# Patient Record
Sex: Male | Born: 1970 | Race: White | Hispanic: No | Marital: Single | Smoking: Current every day smoker
Health system: Southern US, Community
[De-identification: ages and names within clinical notes are randomized; demographics above are authoritative.]

## PROBLEM LIST (undated history)

## (undated) DIAGNOSIS — F419 Anxiety disorder, unspecified: Secondary | ICD-10-CM

## (undated) DIAGNOSIS — G459 Transient cerebral ischemic attack, unspecified: Secondary | ICD-10-CM

## (undated) DIAGNOSIS — Z8489 Family history of other specified conditions: Secondary | ICD-10-CM

## (undated) DIAGNOSIS — R7303 Prediabetes: Secondary | ICD-10-CM

## (undated) DIAGNOSIS — K859 Acute pancreatitis without necrosis or infection, unspecified: Secondary | ICD-10-CM

## (undated) DIAGNOSIS — M1990 Unspecified osteoarthritis, unspecified site: Secondary | ICD-10-CM

## (undated) DIAGNOSIS — F319 Bipolar disorder, unspecified: Secondary | ICD-10-CM

## (undated) DIAGNOSIS — F329 Major depressive disorder, single episode, unspecified: Secondary | ICD-10-CM

## (undated) DIAGNOSIS — F32A Depression, unspecified: Secondary | ICD-10-CM

## (undated) DIAGNOSIS — K219 Gastro-esophageal reflux disease without esophagitis: Secondary | ICD-10-CM

## (undated) DIAGNOSIS — D649 Anemia, unspecified: Secondary | ICD-10-CM

## (undated) DIAGNOSIS — I639 Cerebral infarction, unspecified: Secondary | ICD-10-CM

## (undated) DIAGNOSIS — E039 Hypothyroidism, unspecified: Secondary | ICD-10-CM

## (undated) HISTORY — PX: KNEE ARTHROSCOPY: SUR90

## (undated) HISTORY — PX: APPENDECTOMY: SHX54

## (undated) HISTORY — PX: KNEE SURGERY: SHX244

## (undated) HISTORY — PX: SHOULDER ARTHROSCOPY: SHX128

## (undated) HISTORY — PX: TESTICLE TORSION REDUCTION: SHX795

---

## 2000-10-26 ENCOUNTER — Ambulatory Visit (HOSPITAL_COMMUNITY): Admission: RE | Admit: 2000-10-26 | Discharge: 2000-10-26 | Payer: Self-pay | Admitting: Gastroenterology

## 2001-01-21 ENCOUNTER — Emergency Department (HOSPITAL_COMMUNITY): Admission: EM | Admit: 2001-01-21 | Discharge: 2001-01-22 | Payer: Self-pay | Admitting: Emergency Medicine

## 2001-01-21 ENCOUNTER — Encounter: Payer: Self-pay | Admitting: Emergency Medicine

## 2001-01-22 ENCOUNTER — Encounter: Payer: Self-pay | Admitting: Emergency Medicine

## 2001-07-16 ENCOUNTER — Ambulatory Visit: Admission: RE | Admit: 2001-07-16 | Discharge: 2001-07-16 | Payer: Self-pay | Admitting: Family Medicine

## 2001-07-16 ENCOUNTER — Encounter: Payer: Self-pay | Admitting: Family Medicine

## 2001-10-07 ENCOUNTER — Encounter: Payer: Self-pay | Admitting: Emergency Medicine

## 2001-10-07 ENCOUNTER — Emergency Department (HOSPITAL_COMMUNITY): Admission: EM | Admit: 2001-10-07 | Discharge: 2001-10-07 | Payer: Self-pay | Admitting: Emergency Medicine

## 2003-03-29 ENCOUNTER — Ambulatory Visit (HOSPITAL_BASED_OUTPATIENT_CLINIC_OR_DEPARTMENT_OTHER): Admission: RE | Admit: 2003-03-29 | Discharge: 2003-03-29 | Payer: Self-pay | Admitting: Orthopedic Surgery

## 2005-09-16 ENCOUNTER — Emergency Department (HOSPITAL_COMMUNITY): Admission: EM | Admit: 2005-09-16 | Discharge: 2005-09-16 | Payer: Self-pay | Admitting: Emergency Medicine

## 2006-07-13 ENCOUNTER — Ambulatory Visit: Payer: Self-pay | Admitting: Urology

## 2006-10-11 ENCOUNTER — Emergency Department (HOSPITAL_COMMUNITY): Admission: EM | Admit: 2006-10-11 | Discharge: 2006-10-11 | Payer: Self-pay | Admitting: Emergency Medicine

## 2007-09-08 ENCOUNTER — Emergency Department (HOSPITAL_COMMUNITY): Admission: EM | Admit: 2007-09-08 | Discharge: 2007-09-08 | Payer: Self-pay | Admitting: Emergency Medicine

## 2008-04-03 ENCOUNTER — Emergency Department (HOSPITAL_COMMUNITY): Admission: EM | Admit: 2008-04-03 | Discharge: 2008-04-03 | Payer: Self-pay | Admitting: Emergency Medicine

## 2009-01-26 ENCOUNTER — Emergency Department (HOSPITAL_COMMUNITY): Admission: EM | Admit: 2009-01-26 | Discharge: 2009-01-26 | Payer: Self-pay | Admitting: Emergency Medicine

## 2009-07-31 ENCOUNTER — Emergency Department (HOSPITAL_BASED_OUTPATIENT_CLINIC_OR_DEPARTMENT_OTHER): Admission: EM | Admit: 2009-07-31 | Discharge: 2009-07-31 | Payer: Self-pay | Admitting: Emergency Medicine

## 2010-01-17 ENCOUNTER — Emergency Department (HOSPITAL_COMMUNITY): Admission: EM | Admit: 2010-01-17 | Discharge: 2010-01-17 | Payer: Self-pay | Admitting: Emergency Medicine

## 2010-01-21 ENCOUNTER — Emergency Department (HOSPITAL_COMMUNITY): Admission: EM | Admit: 2010-01-21 | Discharge: 2010-01-21 | Payer: Self-pay | Admitting: Emergency Medicine

## 2010-08-04 ENCOUNTER — Emergency Department (HOSPITAL_COMMUNITY)
Admission: EM | Admit: 2010-08-04 | Discharge: 2010-08-04 | Disposition: A | Discharge status: Home / Self Care | Payer: Self-pay | Attending: Emergency Medicine | Admitting: Emergency Medicine

## 2010-08-04 DIAGNOSIS — Z046 Encounter for general psychiatric examination, requested by authority: Secondary | ICD-10-CM | POA: Insufficient documentation

## 2010-08-04 DIAGNOSIS — F1910 Other psychoactive substance abuse, uncomplicated: Secondary | ICD-10-CM | POA: Insufficient documentation

## 2010-08-04 DIAGNOSIS — R634 Abnormal weight loss: Secondary | ICD-10-CM | POA: Insufficient documentation

## 2010-08-04 LAB — CBC
HCT: 43.3 % (ref 39.0–52.0)
Hemoglobin: 14.8 g/dL (ref 13.0–17.0)
MCH: 27.8 pg (ref 26.0–34.0)
MCHC: 34.2 g/dL (ref 30.0–36.0)
MCV: 81.2 fL (ref 78.0–100.0)
RDW: 13.3 % (ref 11.5–15.5)

## 2010-08-04 LAB — COMPREHENSIVE METABOLIC PANEL
ALT: 15 U/L (ref 0–53)
Alkaline Phosphatase: 69 U/L (ref 39–117)
BUN: 6 mg/dL (ref 6–23)
CO2: 24 mEq/L (ref 19–32)
Calcium: 9.5 mg/dL (ref 8.4–10.5)
GFR calc non Af Amer: >60 mL/min (ref >60)
Glucose, Bld: 99 mg/dL (ref 70–99)
Total Protein: 7.2 g/dL (ref 6.0–8.3)

## 2010-08-04 LAB — URINALYSIS, ROUTINE W REFLEX MICROSCOPIC
Bilirubin Urine: NEGATIVE
Hgb urine dipstick: NEGATIVE
Ketones, ur: 15 mg/dL — AB
Nitrite: NEGATIVE
Urine Glucose, Fasting: NEGATIVE mg/dL
pH: 5.5 (ref 5.0–8.0)

## 2010-08-04 LAB — DIFFERENTIAL
Eosinophils Relative: 1 % (ref 0–5)
Lymphocytes Relative: 30 % (ref 12–46)
Lymphs Abs: 2 10*3/uL (ref 0.7–4.0)
Monocytes Absolute: 0.5 10*3/uL (ref 0.1–1.0)
Monocytes Relative: 8 % (ref 3–12)
Neutro Abs: 3.9 10*3/uL (ref 1.7–7.7)

## 2010-08-04 LAB — RAPID URINE DRUG SCREEN, HOSP PERFORMED
Barbiturates: NOT DETECTED
Cocaine: NOT DETECTED
Opiates: NOT DETECTED
Tetrahydrocannabinol: POSITIVE — AB

## 2010-08-04 LAB — ETHANOL: Alcohol, Ethyl (B): <5 mg/dL (ref 0–10)

## 2010-08-19 ENCOUNTER — Emergency Department (HOSPITAL_BASED_OUTPATIENT_CLINIC_OR_DEPARTMENT_OTHER)
Admission: EM | Admit: 2010-08-19 | Discharge: 2010-08-19 | Disposition: A | Discharge status: Home / Self Care | Payer: Self-pay | Attending: Emergency Medicine | Admitting: Emergency Medicine

## 2010-08-19 DIAGNOSIS — R5383 Other fatigue: Secondary | ICD-10-CM | POA: Insufficient documentation

## 2010-08-19 DIAGNOSIS — R5381 Other malaise: Secondary | ICD-10-CM | POA: Insufficient documentation

## 2010-08-19 DIAGNOSIS — F17200 Nicotine dependence, unspecified, uncomplicated: Secondary | ICD-10-CM | POA: Insufficient documentation

## 2010-08-19 DIAGNOSIS — IMO0002 Reserved for concepts with insufficient information to code with codable children: Secondary | ICD-10-CM | POA: Insufficient documentation

## 2010-09-10 LAB — COMPREHENSIVE METABOLIC PANEL
ALT: 14 U/L (ref 0–53)
AST: 17 U/L (ref 0–37)
Alkaline Phosphatase: 71 U/L (ref 39–117)
Calcium: 9.4 mg/dL (ref 8.4–10.5)
GFR calc Af Amer: >60 mL/min (ref >60)
Glucose, Bld: 94 mg/dL (ref 70–99)
Potassium: 4 mEq/L (ref 3.5–5.1)
Sodium: 145 mEq/L (ref 135–145)
Total Protein: 7.5 g/dL (ref 6.0–8.3)

## 2010-09-10 LAB — CBC
Hemoglobin: 15.3 g/dL (ref 13.0–17.0)
MCHC: 34.8 g/dL (ref 30.0–36.0)
RBC: 5.24 MIL/uL (ref 4.22–5.81)
RDW: 13 % (ref 11.5–15.5)

## 2010-09-10 LAB — URINALYSIS, ROUTINE W REFLEX MICROSCOPIC
Bilirubin Urine: NEGATIVE
Glucose, UA: NEGATIVE mg/dL
Hgb urine dipstick: NEGATIVE
Ketones, ur: NEGATIVE mg/dL
Protein, ur: NEGATIVE mg/dL
Urobilinogen, UA: 0.2 mg/dL (ref 0.0–1.0)

## 2010-09-10 LAB — POCT TOXICOLOGY PANEL: Tetrahydrocannabinol: POSITIVE

## 2010-09-10 LAB — DIFFERENTIAL
Basophils Relative: 2 % — ABNORMAL HIGH (ref 0–1)
Eosinophils Absolute: 0.2 10*3/uL (ref 0.0–0.7)
Eosinophils Relative: 3 % (ref 0–5)
Lymphs Abs: 2.6 10*3/uL (ref 0.7–4.0)
Monocytes Absolute: 0.5 10*3/uL (ref 0.1–1.0)
Monocytes Relative: 8 % (ref 3–12)

## 2010-09-10 LAB — ETHANOL: Alcohol, Ethyl (B): <5 mg/dL (ref 0–10)

## 2010-09-26 LAB — COMPREHENSIVE METABOLIC PANEL
Albumin: 4.3 g/dL (ref 3.5–5.2)
BUN: 4 mg/dL — ABNORMAL LOW (ref 6–23)
Calcium: 9.5 mg/dL (ref 8.4–10.5)
Glucose, Bld: 114 mg/dL — ABNORMAL HIGH (ref 70–99)
Sodium: 139 mEq/L (ref 135–145)
Total Protein: 7.4 g/dL (ref 6.0–8.3)

## 2010-09-26 LAB — URINALYSIS, ROUTINE W REFLEX MICROSCOPIC
Bilirubin Urine: NEGATIVE
Ketones, ur: NEGATIVE mg/dL
Nitrite: NEGATIVE
Protein, ur: NEGATIVE mg/dL
pH: 7.5 (ref 5.0–8.0)

## 2010-09-26 LAB — CBC
HCT: 41.9 % (ref 39.0–52.0)
Hemoglobin: 14.2 g/dL (ref 13.0–17.0)
MCHC: 33.9 g/dL (ref 30.0–36.0)
Platelets: 240 10*3/uL (ref 150–400)
RDW: 13.7 % (ref 11.5–15.5)

## 2010-09-26 LAB — DIFFERENTIAL
Lymphs Abs: 2.6 10*3/uL (ref 0.7–4.0)
Monocytes Relative: 9 % (ref 3–12)
Neutro Abs: 4.5 10*3/uL (ref 1.7–7.7)
Neutrophils Relative %: 58 % (ref 43–77)

## 2010-11-06 NOTE — Op Note (Signed)
NAME:  Alex Turner, Alex Turner                         ACCOUNT NO.:  [id number]   MEDICAL RECORD NO.:  [id number]                   PATIENT TYPE:  AMB   LOCATION:  DSC                                  FACILITY:  [facility]   PHYSICIAN:  Madlyn Frankel. Charlann Boxer, M.D.               DATE OF BIRTH:  11-09-1970   DATE OF PROCEDURE:  03/29/2003  DATE OF DISCHARGE:                                 OPERATIVE REPORT   PREOPERATIVE DIAGNOSIS:  Right knee medial meniscal tear, horizontal  cleavage type.   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  Otherwise intact cartilage with hypertrophic synovium in the  anterior aspect of the knee.   PROCEDURES:  1. Right knee diagnostic and operative arthroscopy with medial meniscal     debridement posteriorly.  2. Anterior synovectomy, partial synovectomy.   SURGEON:  Oley Balm. Charlann Boxer, M.D.   ANESTHESIA:  General.   ESTIMATED BLOOD LOSS:  Minimal.   INDICATION FOR PROCEDURE:  Mr. Allen is a pleasant 40 year old male who  was initially seen following a twisting-type injury involving his right  knee.  He had an MRI performed prior to evaluation by me, which revealed a  cleavage-type tear in the posterior horn of his medial meniscus.  After  discussing risks and benefits in reviewing these with him, as he had had a  prior left knee arthroscopy, he consented for right knee arthroscopy, medial  meniscal debridement.   PROCEDURE IN DETAIL:  The patient was brought to the operative theatre.  Once adequate anesthesia and preoperative antibiotics, 600 mg of Clindamycin  IV, were administered, the patient was positioned supine.  A proximal thigh  tourniquet and leg holder were placed.  The right lower extremity was then  prepped and draped in sterile fashion.  Standard portals were created  beginning with inferior lateral, superior lateral.  Diagnostic  evaluation of  the knee revealed intact patellofemoral cartilage, intact lateral gutter  with no loose bodies, intact lateral compartment, with normal meniscus and  cartilage.  Evaluation of the medial compartment of the knee revealed  abundant anterior synovitis of the anterior aspect of the knee.  Upon  entering the medial compartment of the knee there was evidence of a  degenerative-appearing meniscus that was loose and floppy and wavy mainly in  the posterior horn.  Probe evaluation revealed degenerative meniscus with  allowing the probe to enter the meniscus.  At this point a straight-biting  basket was inserted and debrided, the posterior horn of the medial meniscus,  both the posterior third, central portion of the meniscus.  The shaver was  then introduced and removed the free fragments of meniscus as well as  contoured the remaining meniscus to a smooth border.  The shaver was then  used to debride the anterior synovium that was bogging down the anterior  aspect of his knee, particularly on the medial side.  Following this a re-  evaluation of the knee was carried out to make sure there were no loose  bodies.  A probe was reinserted into the medial compartment to assess  stability of the meniscus, which was stable.  At this point the working  components were removed from the knee, the knee was irrigated with another  couple hundred of saline.  Arthroscopy equipment  was removed.  The portals were reapproximated using 4-0 nylon.  The knee was  injected with 20 mL of 0.25% Marcaine with epinephrine.  The patient's knee  was then placed into a sterile bulky Jones dressing with Adaptic over the  wounds.  The patient was awoken from anesthesia and transferred to the  recovery room in stable condition.                                                Madlyn Frankel Charlann Boxer, M.D.    MDO/MEDQ  D:  03/29/2003  T:  03/30/2003  Job:  [id number]

## 2010-11-06 NOTE — Procedures (Signed)
[facility]  Patient:    Alex Turner, Alex Turner                      MRN: [id number] Proc. Date: 10/26/00 Adm. Date:  [id number] Attending:  Louie Bun CC:         Arvella Merles, M.D.   Procedure Report  PROCEDURE:  Colonoscopy.  INDICATION FOR PROCEDURE:  Family history of colon cancer in a first-degree relative at a young age.  DESCRIPTION OF PROCEDURE:  The patient was placed in the left lateral decubitus position and placed on the pulse monitor with continuous low-flow oxygen delivered by nasal cannula.  He was sedated with 100 mg IV Demerol and 10 mg IV Versed.  The Olympus video colonoscope was inserted into the rectum and advanced to the cecum, confirmed by transillumination at McBurneys point and visualization of the ileocecal valve and appendiceal orifice.  The prep was generally good but somewhat limited in the cecum and the ascending colon. However, with irrigation, I was able to clear this area for adequate inspection.  The cecum, ascending, transverse, descending, and sigmoid colon all appeared normal with no masses, polyps, diverticula, or other mucosal abnormalities.  The rectum likewise appeared normal, and retroflex view of the anus did reveal some small internal hemorrhoids.  The colonoscope was then withdrawn and the patient returned to the recovery room in stable condition. He tolerated the procedure well, and there were no immediate complications.  IMPRESSION:  Small internal hemorrhoids, otherwise normal colonoscopy.  PLAN:  Repeat colonoscopy in five years based on his family history. DD:  10/26/00 TD:  10/27/00 Job: [id number]

## 2011-03-22 LAB — COMPREHENSIVE METABOLIC PANEL
ALT: 19
AST: 21
CO2: 26
Calcium: 9.4
Creatinine, Ser: 0.76
GFR calc Af Amer: >60
GFR calc non Af Amer: >60
Sodium: 137
Total Protein: 7.2

## 2011-03-22 LAB — DIFFERENTIAL
Basophils Relative: 1
Lymphs Abs: 2.5
Monocytes Relative: 6
Neutro Abs: 3.5
Neutrophils Relative %: 54

## 2011-03-22 LAB — URINALYSIS, ROUTINE W REFLEX MICROSCOPIC
Bilirubin Urine: NEGATIVE
Hgb urine dipstick: NEGATIVE
Ketones, ur: NEGATIVE
Nitrite: NEGATIVE
Protein, ur: NEGATIVE
Urobilinogen, UA: 0.2

## 2011-03-22 LAB — CBC
MCHC: 33.3
MCV: 83.6
RDW: 13.7

## 2011-06-25 ENCOUNTER — Encounter: Payer: Self-pay | Admitting: *Deleted

## 2011-06-25 ENCOUNTER — Emergency Department (HOSPITAL_COMMUNITY)
Admission: EM | Admit: 2011-06-25 | Discharge: 2011-06-25 | Disposition: A | Discharge status: Home / Self Care | Payer: Self-pay | Attending: Emergency Medicine | Admitting: Emergency Medicine

## 2011-06-25 DIAGNOSIS — K2970 Gastritis, unspecified, without bleeding: Secondary | ICD-10-CM | POA: Insufficient documentation

## 2011-06-25 DIAGNOSIS — R10819 Abdominal tenderness, unspecified site: Secondary | ICD-10-CM | POA: Insufficient documentation

## 2011-06-25 LAB — COMPREHENSIVE METABOLIC PANEL
ALT: 13 U/L (ref 0–53)
Alkaline Phosphatase: 68 U/L (ref 39–117)
CO2: 26 mEq/L (ref 19–32)
Calcium: 9.9 mg/dL (ref 8.4–10.5)
Chloride: 102 mEq/L (ref 96–112)
GFR calc Af Amer: >90 mL/min (ref >90)
GFR calc non Af Amer: >90 mL/min (ref >90)
Glucose, Bld: 93 mg/dL (ref 70–99)
Sodium: 137 mEq/L (ref 135–145)
Total Bilirubin: 0.3 mg/dL (ref 0.3–1.2)

## 2011-06-25 LAB — CBC
HCT: 42.3 % (ref 39.0–52.0)
MCV: 79.8 fL (ref 78.0–100.0)
Platelets: 306 10*3/uL (ref 150–400)
RBC: 5.3 MIL/uL (ref 4.22–5.81)
WBC: 8.5 10*3/uL (ref 4.0–10.5)

## 2011-06-25 LAB — DIFFERENTIAL
Eosinophils Relative: 2 % (ref 0–5)
Lymphocytes Relative: 35 % (ref 12–46)
Lymphs Abs: 3 10*3/uL (ref 0.7–4.0)
Neutro Abs: 4.7 10*3/uL (ref 1.7–7.7)

## 2011-06-25 MED ORDER — GI COCKTAIL ~~LOC~~
30.0000 mL | Freq: Once | ORAL | Status: AC
  Administered 2011-06-25: 30 mL via ORAL
  Filled 2011-06-25: qty 30

## 2011-06-25 MED ORDER — OMEPRAZOLE 20 MG PO CPDR: 20.0000 mg | DELAYED_RELEASE_CAPSULE | Freq: Every day | ORAL | Status: DC

## 2011-06-25 NOTE — ED Provider Notes (Signed)
History     CSN: [id number]  Arrival date & time 06/25/11  1436   First MD Initiated Contact with Patient 06/25/11 1638      Chief Complaint  Patient presents with  . Abdominal Pain    pt c.o burning sensation in abd, and black tarry stools. reports low energy and "getting full fast."     (Consider location/radiation/quality/duration/timing/severity/associated sxs/prior treatment) Patient is a 41 y.o. male presenting with abdominal pain. The history is provided by the patient.  Abdominal Pain The primary symptoms of the illness include abdominal pain and hematochezia. The primary symptoms of the illness do not include fever, nausea, vomiting, diarrhea or dysuria. Primary symptoms comment: Burning epigastric pain for 2-3 days and dark, black stools. The onset of the illness was gradual.  Associated with: He has a history of alcohol use but none in 3 months. He has used NSAIDs in limited quantity. No history of GI bleeding. Change in bowel habit: He reports change in calibur of stools sometimes to pencil thin size. Symptoms associated with the illness do not include chills or hematuria.  He underwent colonoscopy in 2002 secondary to family history premature colon cancers that was negative at the time. He has not had another scope since that time.  History reviewed. No pertinent past medical history.  Past Surgical History  Procedure Date  . Appendectomy     History reviewed. No pertinent family history.  History  Substance Use Topics  . Smoking status: Current Everyday Smoker    Types: Cigarettes  . Smokeless tobacco: Not on file  . Alcohol Use: No      Review of Systems  Constitutional: Negative for fever and chills.  HENT: Negative.   Respiratory: Negative.   Cardiovascular: Negative.   Gastrointestinal: Positive for abdominal pain, blood in stool and hematochezia. Negative for nausea, vomiting and diarrhea.  Genitourinary: Negative for dysuria and hematuria.    Musculoskeletal: Negative.   Skin: Negative.   Neurological: Negative.     Allergies  Penicillins and Wellbutrin  Home Medications   Current Outpatient Rx  Name Route Sig Dispense Refill  . BISMUTH SUBSALICYLATE 262 MG PO CHEW Oral Chew 524 mg by mouth as needed. Upset stomach     . IBUPROFEN 200 MG PO TABS Oral Take 200 mg by mouth every 6 (six) hours as needed.        BP 121/75  Pulse 66  Temp(Src) 98.8 F (37.1 C) (Oral)  Resp 18  Wt 170 lb (77.111 kg)  SpO2 97%  Physical Exam  Constitutional: He appears well-developed and well-nourished.  HENT:  Head: Normocephalic.  Neck: Normal range of motion. Neck supple.  Cardiovascular: Normal rate and regular rhythm.   Pulmonary/Chest: Effort normal and breath sounds normal.  Abdominal: Soft. Bowel sounds are normal. There is tenderness. There is no rebound and no guarding.       Epigastric and upper quadrant tenderness bilaterally.  Genitourinary: Rectum normal. Guaiac negative stool.  Musculoskeletal: Normal range of motion.  Neurological: He is alert. No cranial nerve deficit.  Skin: Skin is warm and dry. No rash noted.  Psychiatric: He has a normal mood and affect.    ED Course  Procedures (including critical care time)  Labs Reviewed - No data to display No results found.   No diagnosis found.    MDM  Pain is some better with GI Cocktail. Labs unremarkable and guaiac negative. Will discharge home on PPI and recommend primary care follow up.  Rodena Medin, PA 06/25/11 [phone]

## 2011-06-25 NOTE — ED Notes (Signed)
Pt presenting to ed with c/o abdominal pain and burning. Pt states symptoms having been going on for a couple of weeks. Pt states difficulty with urinating at time. Pt states decreased appetite. Pt states he had black tarry stools. Pt states he has also had diarrhea.

## 2011-06-25 NOTE — ED Notes (Signed)
Rainbow drawn. 1 lavender, 1 blue, 1 light green, 1 dark green sent to lab. Urine sent to lab

## 2011-07-02 NOTE — ED Provider Notes (Signed)
Evaluation and management procedures were performed by the PA/NP under my supervision/collaboration.    Haskell Rihn D Glynn Yepes, MD 07/02/11 1630 

## 2013-12-30 ENCOUNTER — Emergency Department (HOSPITAL_COMMUNITY): Payer: No Typology Code available for payment source

## 2013-12-30 ENCOUNTER — Encounter (HOSPITAL_COMMUNITY): Payer: Self-pay | Admitting: Emergency Medicine

## 2013-12-30 ENCOUNTER — Emergency Department (HOSPITAL_COMMUNITY)
Admission: EM | Admit: 2013-12-30 | Discharge: 2013-12-30 | Disposition: A | Discharge status: Home / Self Care | Payer: No Typology Code available for payment source | Attending: Emergency Medicine | Admitting: Emergency Medicine

## 2013-12-30 DIAGNOSIS — M79609 Pain in unspecified limb: Secondary | ICD-10-CM | POA: Insufficient documentation

## 2013-12-30 DIAGNOSIS — M25562 Pain in left knee: Secondary | ICD-10-CM

## 2013-12-30 DIAGNOSIS — Z880 Allergy status to penicillin: Secondary | ICD-10-CM | POA: Insufficient documentation

## 2013-12-30 DIAGNOSIS — Z79899 Other long term (current) drug therapy: Secondary | ICD-10-CM | POA: Insufficient documentation

## 2013-12-30 DIAGNOSIS — F17200 Nicotine dependence, unspecified, uncomplicated: Secondary | ICD-10-CM | POA: Insufficient documentation

## 2013-12-30 DIAGNOSIS — M25569 Pain in unspecified knee: Secondary | ICD-10-CM | POA: Insufficient documentation

## 2013-12-30 DIAGNOSIS — M1990 Unspecified osteoarthritis, unspecified site: Secondary | ICD-10-CM | POA: Insufficient documentation

## 2013-12-30 DIAGNOSIS — K219 Gastro-esophageal reflux disease without esophagitis: Secondary | ICD-10-CM | POA: Insufficient documentation

## 2013-12-30 HISTORY — DX: Unspecified osteoarthritis, unspecified site: M19.90

## 2013-12-30 HISTORY — DX: Prediabetes: R73.03

## 2013-12-30 HISTORY — DX: Gastro-esophageal reflux disease without esophagitis: K21.9

## 2013-12-30 HISTORY — DX: Acute pancreatitis without necrosis or infection, unspecified: K85.90

## 2013-12-30 MED ORDER — HYDROCODONE-ACETAMINOPHEN 5-325 MG PO TABS
2.0000 | ORAL_TABLET | Freq: Once | ORAL | Status: AC
  Administered 2013-12-30: 2 via ORAL
  Filled 2013-12-30: qty 2

## 2013-12-30 MED ORDER — TRAMADOL HCL 50 MG PO TABS: 50.0000 mg | ORAL_TABLET | Freq: Four times a day (QID) | ORAL | Status: DC | PRN

## 2013-12-30 NOTE — Progress Notes (Signed)
VASCULAR LAB PRELIMINARY  PRELIMINARY  PRELIMINARY  PRELIMINARY  Bilateral lower extremity venous duplex completed.    Preliminary report:  Bilateral:  No evidence of DVT, superficial thrombosis, or Baker's Cyst.   Myan Suit, RVS 12/30/2013, 11:26 AM

## 2013-12-30 NOTE — ED Notes (Addendum)
Pt c/o pain and unable to bear weight on knee now after doing a lot of walking this week. Pt denies injury but states that he had surgery on L knee when he was 16.  Pt has no other c/o and is A&O and in NAD

## 2013-12-30 NOTE — ED Notes (Addendum)
Pt reports hx of left knee surgery when he was 43 years old. Denies trauma or injury. Pt reports left knee pain x1 month, but pain increased on Friday 7/10. Now pain radiates down leg into calf, left foot is numb as well. Pain 9/10.pain with ambulation and when apply weight to foot.

## 2013-12-30 NOTE — ED Notes (Signed)
Vascular tech in with pt at this time

## 2013-12-30 NOTE — ED Provider Notes (Signed)
CSN: [id number]     Arrival date & time 12/30/13  [id number] History   First MD Initiated Contact with Patient 12/30/13 0848     Chief Complaint  Patient presents with  . Knee Pain  . calf pain      (Consider location/radiation/quality/duration/timing/severity/associated sxs/prior Treatment) Patient is a 43 y.o. male presenting with knee pain. The history is provided by the patient.  Knee Pain Associated symptoms: no back pain and no fever   pt c/o left knee pain for the past couple days. Pain is located anteriorly, constant, dull, moderate-sev, non radiating. States hx left knee pain/problems since age 43 when sprained/injured playing soccer, had laparoscopic procedure then.  States had been up on left more than normal, increased walking earlier in week.  Now pain in front and medial aspect knee, worse w walking, or straightening. No specific injury recalled. No redness. No skin changes. Pt states lower leg feels swollen/painful as well. No fever or chills.  No hx dvt or pe.     Past Medical History  Diagnosis Date  . Pancreatitis   . Osteoarthritis   . Acid reflux   . Borderline diabetic    Past Surgical History  Procedure Laterality Date  . Appendectomy    . Knee surgery      left knee at 43 yo    History reviewed. No pertinent family history. History  Substance Use Topics  . Smoking status: Current Every Day Smoker    Types: Cigarettes  . Smokeless tobacco: Not on file  . Alcohol Use: No    Review of Systems  Constitutional: Negative for fever.  Respiratory: Negative for shortness of breath.   Cardiovascular: Negative for chest pain and leg swelling.  Musculoskeletal: Negative for back pain.  Skin: Negative for rash and wound.  Neurological: Negative for weakness and numbness.      Allergies  Penicillins and Wellbutrin  Home Medications   Prior to Admission medications   Medication Sig Start Date End Date Taking? Authorizing Provider  baclofen (LIORESAL) 10 MG  tablet Take 10 mg by mouth 3 (three) times daily.   Yes Historical Provider, MD  busPIRone (BUSPAR) 5 MG tablet Take 5 mg by mouth daily.   Yes Historical Provider, MD  cholecalciferol (VITAMIN D) 1000 UNITS tablet Take 1,000 Units by mouth 2 (two) times daily.   Yes Historical Provider, MD  gabapentin (NEURONTIN) 300 MG capsule Take 300 mg by mouth 2 (two) times daily.   Yes Historical Provider, MD  ibuprofen (ADVIL,MOTRIN) 200 MG tablet Take 800 mg by mouth every 6 (six) hours as needed for mild pain or moderate pain.    Yes Historical Provider, MD  loratadine (CLARITIN) 10 MG tablet Take 10 mg by mouth daily.   Yes Historical Provider, MD  meloxicam (MOBIC) 15 MG tablet Take 15 mg by mouth daily.   Yes Historical Provider, MD  Multiple Vitamin (MULTIVITAMIN WITH MINERALS) TABS tablet Take 1 tablet by mouth daily.   Yes Historical Provider, MD  omeprazole (PRILOSEC) 20 MG capsule Take 20 mg by mouth 2 (two) times daily before a meal.   Yes Historical Provider, MD  ondansetron (ZOFRAN) 4 MG tablet Take 4 mg by mouth every 6 (six) hours as needed for nausea or vomiting.   Yes Historical Provider, MD  Oxcarbazepine (TRILEPTAL) 300 MG tablet Take 300 mg by mouth 2 (two) times daily.   Yes Historical Provider, MD  terazosin (HYTRIN) 5 MG capsule Take 5 mg by mouth at bedtime.  Yes Historical Provider, MD  traZODone (DESYREL) 50 MG tablet Take 50 mg by mouth at bedtime.   Yes Historical Provider, MD   BP 116/66  Pulse 67  Temp(Src) 98.2 F (36.8 C) (Oral)  Resp 16  Wt 197 lb (89.359 kg)  SpO2 98% Physical Exam  Nursing note and vitals reviewed. Constitutional: He is oriented to person, place, and time. He appears well-developed and well-nourished. No distress.  HENT:  Head: Atraumatic.  Eyes: Conjunctivae are normal.  Neck: Neck supple. No tracheal deviation present.  Cardiovascular: Normal rate.   Pulmonary/Chest: Effort normal. No accessory muscle usage. No respiratory distress.   Musculoskeletal: Normal range of motion. He exhibits no edema.  Tenderness left knee anteriorly/medially. No sts/effusion. No skin changes or erythema. Knees stable, no gross ligament laxity. No leg swelling/edema. No focal calf tenderness. Distal pulses palp.   Good rom without pain, no findings of septic joint.   Neurological: He is alert and oriented to person, place, and time.  Skin: Skin is warm and dry. He is not diaphoretic.  Psychiatric: He has a normal mood and affect.    ED Course  Procedures (including critical care time) Labs Review   MDM  Xrays.   No meds for pain pta, pt has ride, does not have to drive.  vicodin po.  Reviewed nursing notes and prior charts for additional history.   Vascular tech states doppler neg for dvt, see report below:   Baltasar, Twilley Male [birth year] [id number]            Progress Notes by Kerrin Champagne at 12/30/2013 11:26 AM    Author: Kerrin Champagne Service: Vascular Lab Author Type: Cardiovascular Sonographer   Filed: 12/30/2013 11:27 AM Note Time: 12/30/2013 11:26 AM Status: Signed   Editor: Nolberto Hanlon Slaughter (Cardiovascular Sonographer)      VASCULAR LAB  PRELIMINARY PRELIMINARY PRELIMINARY PRELIMINARY  Bilateral lower extremity venous duplex completed.  Preliminary report: Bilateral: No evidence of DVT, superficial thrombosis, or Baker's Cyst.  SLAUGHTER, [place], RVS  12/30/2013, 11:26 AM     Suzi Roots, MD 12/30/13 1135

## 2013-12-30 NOTE — ED Notes (Signed)
Patient transported to X-ray 

## 2013-12-30 NOTE — ED Notes (Signed)
Gave pt graham crackers and sprite per request after ok by Dr Denton LankSteinl

## 2013-12-30 NOTE — Discharge Instructions (Signed)
Take motrin as need for pain. You may also take ultram as need for pain - no driving when taking. Follow up with orthopedist in the next few weeks if symptoms fail to improve/resolve. Return to ER if worse, new symptoms, fevers, other concern.  You were given pain medication in the ER - no driving for the next 4 hours.   Knee Pain The knee is the complex joint between your thigh and your lower leg. It is made up of bones, tendons, ligaments, and cartilage. The bones that make up the knee are:  The femur in the thigh.  The tibia and fibula in the lower leg.  The patella or kneecap riding in the groove on the lower femur. CAUSES  Knee pain is a common complaint with many causes. A few of these causes are:  Injury, such as:  A ruptured ligament or tendon injury.  Torn cartilage.  Medical conditions, such as:  Gout  Arthritis  Infections  Overuse, over training, or overdoing a physical activity. Knee pain can be minor or severe. Knee pain can accompany debilitating injury. Minor knee problems often respond well to self-care measures or get well on their own. More serious injuries may need medical intervention or even surgery. SYMPTOMS The knee is complex. Symptoms of knee problems can vary widely. Some of the problems are:  Pain with movement and weight bearing.  Swelling and tenderness.  Buckling of the knee.  Inability to straighten or extend your knee.  Your knee locks and you cannot straighten it.  Warmth and redness with pain and fever.  Deformity or dislocation of the kneecap. DIAGNOSIS  Determining what is wrong may be very straight forward such as when there is an injury. It can also be challenging because of the complexity of the knee. Tests to make a diagnosis may include:  Your caregiver taking a history and doing a physical exam.  Routine X-rays can be used to rule out other problems. X-rays will not reveal a cartilage tear. Some injuries of the knee  can be diagnosed by:  Arthroscopy a surgical technique by which a small video camera is inserted through tiny incisions on the sides of the knee. This procedure is used to examine and repair internal knee joint problems. Tiny instruments can be used during arthroscopy to repair the torn knee cartilage (meniscus).  Arthrography is a radiology technique. A contrast liquid is directly injected into the knee joint. Internal structures of the knee joint then become visible on X-ray film.  An MRI scan is a non X-ray radiology procedure in which magnetic fields and a computer produce two- or three-dimensional images of the inside of the knee. Cartilage tears are often visible using an MRI scanner. MRI scans have largely replaced arthrography in diagnosing cartilage tears of the knee.  Blood work.  Examination of the fluid that helps to lubricate the knee joint (synovial fluid). This is done by taking a sample out using a needle and a syringe. TREATMENT The treatment of knee problems depends on the cause. Some of these treatments are:  Depending on the injury, proper casting, splinting, surgery, or physical therapy care will be needed.  Give yourself adequate recovery time. Do not overuse your joints. If you begin to get sore during workout routines, back off. Slow down or do fewer repetitions.  For repetitive activities such as cycling or running, maintain your strength and nutrition.  Alternate muscle groups. For example, if you are a weight lifter, work the upper body  on one day and the lower body the next.  Either tight or weak muscles do not give the proper support for your knee. Tight or weak muscles do not absorb the stress placed on the knee joint. Keep the muscles surrounding the knee strong.  Take care of mechanical problems.  If you have flat feet, orthotics or special shoes may help. See your caregiver if you need help.  Arch supports, sometimes with wedges on the inner or outer  aspect of the heel, can help. These can shift pressure away from the side of the knee most bothered by osteoarthritis.  A brace called an "unloader" brace also may be used to help ease the pressure on the most arthritic side of the knee.  If your caregiver has prescribed crutches, braces, wraps or ice, use as directed. The acronym for this is PRICE. This means protection, rest, ice, compression, and elevation.  Nonsteroidal anti-inflammatory drugs (NSAIDs), can help relieve pain. But if taken immediately after an injury, they may actually increase swelling. Take NSAIDs with food in your stomach. Stop them if you develop stomach problems. Do not take these if you have a history of ulcers, stomach pain, or bleeding from the bowel. Do not take without your caregiver's approval if you have problems with fluid retention, heart failure, or kidney problems.  For ongoing knee problems, physical therapy may be helpful.  Glucosamine and chondroitin are over-the-counter dietary supplements. Both may help relieve the pain of osteoarthritis in the knee. These medicines are different from the usual anti-inflammatory drugs. Glucosamine may decrease the rate of cartilage destruction.  Injections of a corticosteroid drug into your knee joint may help reduce the symptoms of an arthritis flare-up. They may provide pain relief that lasts a few months. You may have to wait a few months between injections. The injections do have a small increased risk of infection, water retention, and elevated blood sugar levels.  Hyaluronic acid injected into damaged joints may ease pain and provide lubrication. These injections may work by reducing inflammation. A series of shots may give relief for as long as 6 months.  Topical painkillers. Applying certain ointments to your skin may help relieve the pain and stiffness of osteoarthritis. Ask your pharmacist for suggestions. Many over the-counter products are approved for temporary  relief of arthritis pain.  In some countries, doctors often prescribe topical NSAIDs for relief of chronic conditions such as arthritis and tendinitis. A review of treatment with NSAID creams found that they worked as well as oral medications but without the serious side effects. PREVENTION  Maintain a healthy weight. Extra pounds put more strain on your joints.  Get strong, stay limber. Weak muscles are a common cause of knee injuries. Stretching is important. Include flexibility exercises in your workouts.  Be smart about exercise. If you have osteoarthritis, chronic knee pain or recurring injuries, you may need to change the way you exercise. This does not mean you have to stop being active. If your knees ache after jogging or playing basketball, consider switching to swimming, water aerobics, or other low-impact activities, at least for a few days a week. Sometimes limiting high-impact activities will provide relief.  Make sure your shoes fit well. Choose footwear that is right for your sport.  Protect your knees. Use the proper gear for knee-sensitive activities. Use kneepads when playing volleyball or laying carpet. Buckle your seat belt every time you drive. Most shattered kneecaps occur in car accidents.  Rest when you are tired. SEEK  MEDICAL CARE IF:  You have knee pain that is continual and does not seem to be getting better.  SEEK IMMEDIATE MEDICAL CARE IF:  Your knee joint feels hot to the touch and you have a high fever. MAKE SURE YOU:   Understand these instructions.  Will watch your condition.  Will get help right away if you are not doing well or get worse. Document Released: 04/04/2007 Document Revised: 08/30/2011 Document Reviewed: 04/04/2007 [facility] Patient Information 2015 [place], [place]. This information is not intended to replace advice given to you by your health care provider. Make sure you discuss any questions you have with your health care provider.

## 2014-03-06 ENCOUNTER — Other Ambulatory Visit: Payer: Self-pay | Admitting: Gastroenterology

## 2014-03-06 DIAGNOSIS — R1013 Epigastric pain: Secondary | ICD-10-CM

## 2014-03-06 DIAGNOSIS — R197 Diarrhea, unspecified: Secondary | ICD-10-CM

## 2014-03-15 ENCOUNTER — Ambulatory Visit
Admission: RE | Admit: 2014-03-15 | Discharge: 2014-03-15 | Disposition: A | Discharge status: Home / Self Care | Payer: No Typology Code available for payment source | Source: Ambulatory Visit | Attending: Gastroenterology | Admitting: Gastroenterology

## 2014-03-15 DIAGNOSIS — R197 Diarrhea, unspecified: Secondary | ICD-10-CM

## 2014-03-15 DIAGNOSIS — R1013 Epigastric pain: Secondary | ICD-10-CM

## 2014-03-20 ENCOUNTER — Encounter (HOSPITAL_COMMUNITY): Payer: Self-pay | Admitting: Pharmacy Technician

## 2014-03-20 ENCOUNTER — Encounter (HOSPITAL_COMMUNITY): Payer: Self-pay | Admitting: *Deleted

## 2014-03-27 ENCOUNTER — Other Ambulatory Visit: Payer: Self-pay | Admitting: Gastroenterology

## 2014-03-28 ENCOUNTER — Encounter (HOSPITAL_COMMUNITY): Payer: Self-pay | Admitting: Anesthesiology

## 2014-03-28 ENCOUNTER — Encounter (HOSPITAL_COMMUNITY)
Admission: RE | Disposition: A | Discharge status: Home / Self Care | Payer: Self-pay | Source: Ambulatory Visit | Attending: Gastroenterology

## 2014-03-28 ENCOUNTER — Ambulatory Visit (HOSPITAL_COMMUNITY): Payer: No Typology Code available for payment source | Admitting: Anesthesiology

## 2014-03-28 ENCOUNTER — Encounter (HOSPITAL_COMMUNITY): Payer: Self-pay | Admitting: *Deleted

## 2014-03-28 ENCOUNTER — Ambulatory Visit (HOSPITAL_COMMUNITY)
Admission: RE | Admit: 2014-03-28 | Discharge: 2014-03-28 | Disposition: A | Discharge status: Home / Self Care | Payer: Self-pay | Source: Ambulatory Visit | Attending: Gastroenterology | Admitting: Gastroenterology

## 2014-03-28 DIAGNOSIS — F319 Bipolar disorder, unspecified: Secondary | ICD-10-CM | POA: Insufficient documentation

## 2014-03-28 DIAGNOSIS — K219 Gastro-esophageal reflux disease without esophagitis: Secondary | ICD-10-CM | POA: Insufficient documentation

## 2014-03-28 DIAGNOSIS — F4310 Post-traumatic stress disorder, unspecified: Secondary | ICD-10-CM | POA: Insufficient documentation

## 2014-03-28 DIAGNOSIS — Z800 Family history of malignant neoplasm of digestive organs: Secondary | ICD-10-CM | POA: Insufficient documentation

## 2014-03-28 DIAGNOSIS — K625 Hemorrhage of anus and rectum: Secondary | ICD-10-CM | POA: Insufficient documentation

## 2014-03-28 DIAGNOSIS — M158 Other polyosteoarthritis: Secondary | ICD-10-CM | POA: Insufficient documentation

## 2014-03-28 DIAGNOSIS — F609 Personality disorder, unspecified: Secondary | ICD-10-CM | POA: Insufficient documentation

## 2014-03-28 DIAGNOSIS — Z880 Allergy status to penicillin: Secondary | ICD-10-CM | POA: Insufficient documentation

## 2014-03-28 DIAGNOSIS — F17210 Nicotine dependence, cigarettes, uncomplicated: Secondary | ICD-10-CM | POA: Insufficient documentation

## 2014-03-28 DIAGNOSIS — Z888 Allergy status to other drugs, medicaments and biological substances status: Secondary | ICD-10-CM | POA: Insufficient documentation

## 2014-03-28 DIAGNOSIS — K2950 Unspecified chronic gastritis without bleeding: Secondary | ICD-10-CM | POA: Insufficient documentation

## 2014-03-28 DIAGNOSIS — F419 Anxiety disorder, unspecified: Secondary | ICD-10-CM | POA: Insufficient documentation

## 2014-03-28 DIAGNOSIS — K648 Other hemorrhoids: Secondary | ICD-10-CM | POA: Insufficient documentation

## 2014-03-28 HISTORY — DX: Family history of other specified conditions: Z84.89

## 2014-03-28 HISTORY — PX: ESOPHAGOGASTRODUODENOSCOPY (EGD) WITH PROPOFOL: SHX5813

## 2014-03-28 HISTORY — DX: Major depressive disorder, single episode, unspecified: F32.9

## 2014-03-28 HISTORY — DX: Anxiety disorder, unspecified: F41.9

## 2014-03-28 HISTORY — DX: Depression, unspecified: F32.A

## 2014-03-28 HISTORY — DX: Bipolar disorder, unspecified: F31.9

## 2014-03-28 HISTORY — DX: Anemia, unspecified: D64.9

## 2014-03-28 HISTORY — PX: COLONOSCOPY WITH PROPOFOL: SHX5780

## 2014-03-28 HISTORY — DX: Hypothyroidism, unspecified: E03.9

## 2014-03-28 SURGERY — ESOPHAGOGASTRODUODENOSCOPY (EGD) WITH PROPOFOL
Anesthesia: Monitor Anesthesia Care

## 2014-03-28 MED ORDER — LACTATED RINGERS IV SOLN: 100.0000 mL | INTRAVENOUS | Status: DC

## 2014-03-28 MED ORDER — KETAMINE HCL 10 MG/ML IJ SOLN
INTRAMUSCULAR | Status: DC | PRN
  Administered 2014-03-28: 40 mg via INTRAVENOUS

## 2014-03-28 MED ORDER — FENTANYL CITRATE 0.05 MG/ML IJ SOLN
INTRAMUSCULAR | Status: DC | PRN
  Administered 2014-03-28 (×2): 50 ug via INTRAVENOUS

## 2014-03-28 MED ORDER — FENTANYL CITRATE 0.05 MG/ML IJ SOLN
INTRAMUSCULAR | Status: AC
  Filled 2014-03-28: qty 2

## 2014-03-28 MED ORDER — PROPOFOL 10 MG/ML IV BOLUS
INTRAVENOUS | Status: AC
Start: 2014-03-28 — End: 2014-03-28
  Filled 2014-03-28: qty 20

## 2014-03-28 MED ORDER — PROPOFOL 10 MG/ML IV BOLUS
INTRAVENOUS | Status: AC
  Filled 2014-03-28: qty 20

## 2014-03-28 MED ORDER — LIDOCAINE HCL (CARDIAC) 20 MG/ML IV SOLN
INTRAVENOUS | Status: DC | PRN
  Administered 2014-03-28: 100 mg via INTRAVENOUS

## 2014-03-28 MED ORDER — GLYCOPYRROLATE 0.2 MG/ML IJ SOLN
INTRAMUSCULAR | Status: AC
  Filled 2014-03-28: qty 1

## 2014-03-28 MED ORDER — GLYCOPYRROLATE 0.2 MG/ML IJ SOLN
INTRAMUSCULAR | Status: DC | PRN
  Administered 2014-03-28: 0.2 mg via INTRAVENOUS

## 2014-03-28 MED ORDER — MIDAZOLAM HCL 2 MG/2ML IJ SOLN
INTRAMUSCULAR | Status: AC
  Filled 2014-03-28: qty 2

## 2014-03-28 MED ORDER — SODIUM CHLORIDE 0.9 % IV SOLN: INTRAVENOUS | Status: DC

## 2014-03-28 MED ORDER — LACTATED RINGERS IV SOLN
INTRAVENOUS | Status: DC
  Administered 2014-03-28: 11:00:00 via INTRAVENOUS

## 2014-03-28 MED ORDER — PROPOFOL INFUSION 10 MG/ML OPTIME
INTRAVENOUS | Status: DC | PRN
  Administered 2014-03-28: 140 ug/kg/min via INTRAVENOUS

## 2014-03-28 MED ORDER — BUTAMBEN-TETRACAINE-BENZOCAINE 2-2-14 % EX AERO
INHALATION_SPRAY | CUTANEOUS | Status: DC | PRN
  Administered 2014-03-28: 2 via TOPICAL

## 2014-03-28 MED ORDER — MIDAZOLAM HCL 5 MG/5ML IJ SOLN
INTRAMUSCULAR | Status: DC | PRN
  Administered 2014-03-28 (×2): 1 mg via INTRAVENOUS

## 2014-03-28 SURGICAL SUPPLY — 24 items

## 2014-03-28 NOTE — Anesthesia Postprocedure Evaluation (Signed)
  Anesthesia Post-op Note  Patient: Alex BroadDavid M Turner  Procedure(s) Performed: Procedure(s) (LRB): ESOPHAGOGASTRODUODENOSCOPY (EGD) WITH PROPOFOL (N/A) COLONOSCOPY WITH PROPOFOL (N/A)  Patient Location: PACU  Anesthesia Type: MAC  Level of Consciousness: awake and alert   Airway and Oxygen Therapy: Patient Spontanous Breathing  Post-op Pain: mild  Post-op Assessment: Post-op Vital signs reviewed, Patient's Cardiovascular Status Stable, Respiratory Function Stable, Patent Airway and No signs of Nausea or vomiting  Last Vitals:  Filed Vitals:   03/28/14 1310  BP: 131/77  Pulse: 64  Temp:   Resp: 12    Post-op Vital Signs: stable   Complications: No apparent anesthesia complications

## 2014-03-28 NOTE — Transfer of Care (Signed)
Immediate Anesthesia Transfer of Care Note  Patient: Alex BroadDavid M Lupercio  Procedure(s) Performed: Procedure(s): ESOPHAGOGASTRODUODENOSCOPY (EGD) WITH PROPOFOL (N/A) COLONOSCOPY WITH PROPOFOL (N/A)  Patient Location: PACU  Anesthesia Type:MAC  Level of Consciousness: awake  Airway & Oxygen Therapy: Patient Spontanous Breathing and Patient connected to nasal cannula oxygen  Post-op Assessment: Report given to PACU RN and Post -op Vital signs reviewed and stable  Post vital signs: Reviewed and stable  Complications: No apparent anesthesia complications

## 2014-03-28 NOTE — Op Note (Signed)
[facility] [address] [place], [id number]   COLONOSCOPY PROCEDURE REPORT  PATIENT: Alex Turner, Alex Turner  MR#: [id number] BIRTHDATE: 1971-06-20 , 43  yrs. old GENDER: male ENDOSCOPIST: Dorena CookeyJohn Ismael Karge, MD REFERRED BY: PROCEDURE DATE:  03/28/2014 PROCEDURE: ASA CLASS: INDICATIONS:rectal bleeding, family history of colon cancer in a first-degree relative MEDICATIONS: MAC  DESCRIPTION OF PROCEDURE:   After the risks and benefits and of the procedure were explained, informed consent was obtained.  digital exam was normal         The Pentax Ped Colon [id number]  endoscope was introduced through the anus and advanced to the cecum, confirmed by transillumination McBurney's point in visualization of ileocecal valve and appendiceal orifice.      .  The quality of the prep was excellent      .  The instrument was then slowly withdrawn as the colon was fully examined.   the cecum, descending, transverse descending and sigmoid colon appeared normal with no masses polyps diverticula or other mucosal abnormalities.          retroflex view the anus revealed small internal hemorrhoids.          The scope was then withdrawn from the patient and the procedure completed.  WITHDRAWAL TIME: 10 minutes  COMPLICATIONS: There were no immediate complications. ENDOSCOPIC IMPRESSION: small internal hemorrhoids otherwise normal study RECOMMENDATIONS: repeat colonoscopy in 5 years based on family history of colon cancer in first-degree relatives.  REPEAT EXAM:  cc:  _______________________________ Rosalie DoctoreSignedDorena Cookey:  Shaniqua Guillot, MD 03/28/2014 12:44 PM   CPT CODES: ICD CODES:  The ICD and CPT codes recommended by this software are interpretations from the data that the clinical staff has captured with the software.  The verification of the translation of this report to the ICD and CPT codes and modifiers is the sole responsibility of the health care institution and practicing physician  where this report was generated.  [employer] will not be held responsible for the validity of the ICD and CPT codes included on this report.  AMA assumes no liability for data contained or not contained herein. CPT is a Publishing rights managerregistered trademark of the [employer].
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
## 2014-03-28 NOTE — Op Note (Signed)
[facility] [address]  [place], [id number]   ENDOSCOPY PROCEDURE REPORT  PATIENT: Alex BroadSchultz, [name]  MR#: [id number] BIRTHDATE: 09/28/1970 , 43  yrs. old GENDER: male ENDOSCOPIST:Sophiea Ueda Madilyn FiremanHayes, MD REFERRED BY: PROCEDURE DATE:  03/28/2014 PROCEDURE: ASA CLASS: INDICATIONS: epigastric pain MEDICATION: MAC TOPICAL ANESTHETIC:   Cetacaine  DESCRIPTION OF PROCEDURE:   After the risks and benefits of the procedure were explained, informed consent was obtained.  The Pentax Gastroscope [id number]  endoscope was introduced through the mouth  and advanced to the second portion of the duodenum .  The instrument was slowly withdrawn as the mucosa was fully examined.    esophagus: Normal        stomach: Mild antral gastritis with no focal erosions or ulcers. Biopsies taken. Duodenum: Slight deformity of the bulb otherwise normal       The scope was then withdrawn from the patient and the procedure completed.  COMPLICATIONS: There were no immediate complications.  ENDOSCOPIC IMPRESSION: mild antral gastritis RECOMMENDATIONS: continue double dose PPI and await antral biopsies.  jhayes _______________________________ Rosalie DoctoreSignedDorena Cookey:  Hasnain Manheim, MD 03/28/2014 12:23 PM     cc:  CPT CODES: ICD CODES:  The ICD and CPT codes recommended by this software are interpretations from the data that the clinical staff has captured with the software.  The verification of the translation of this report to the ICD and CPT codes and modifiers is the sole responsibility of the health care institution and practicing physician where this report was generated.  [employer] will not be held responsible for the validity of the ICD and CPT codes included on this report.  AMA assumes no liability for data contained or not contained herein. CPT is a Publishing rights managerregistered trademark of the [employer].

## 2014-03-28 NOTE — Discharge Instructions (Signed)
YOU HAD AN ENDOSCOPIC PROCEDURE TODAY: Refer to the procedure report that was given to you for any specific questions about what was found during the examination.  If the procedure report does not answer your questions, please call your gastroenterologist to clarify.  YOU SHOULD EXPECT: Some feelings of bloating in the abdomen. Passage of more gas than usual.  Walking can help get rid of the air that was put into your GI tract during the procedure and reduce the bloating. If you had a lower endoscopy (such as a colonoscopy or flexible sigmoidoscopy) you may notice spotting of blood in your stool or on the toilet paper.   DIET: Your first meal following the procedure should be a light meal and then it is ok to progress to your normal diet.  A half-sandwich or bowl of soup is an example of a good first meal.  Heavy or fried foods are harder to digest and may make you feel nasueas or bloated.  Drink plenty of fluids but you should avoid alcoholic beverages for 24 hours.  ACTIVITY: Your care partner should take you home directly after the procedure.  You should plan to take it easy, moving slowly for the rest of the day.  You can resume normal activity the day after the procedure however you should NOT DRIVE or use heavy machinery for 24 hours (because of the sedation medicines used during the test).    SYMPTOMS TO REPORT IMMEDIATELY  A gastroenterologist can be reached at any hour.  Please call your doctor's office for any of the following symptoms:   Following lower endoscopy (colonoscopy, flexible sigmoidoscopy)  Excessive amounts of blood in the stool  Significant tenderness, worsening of abdominal pains  Swelling of the abdomen that is new, acute  Fever of 100 or higher  Following upper endoscopy (EGD, EUS, ERCP)  Vomiting of blood or coffee ground material  New, significant abdominal pain  New, significant chest pain or pain under the shoulder blades  Painful or persistently difficult  swallowing  New shortness of breath  Black, tarry-looking stools  FOLLOW UP: If any biopsies were taken you will be contacted by phone or by letter within the next 1-3 weeks.  Call your gastroenterologist if you have not heard about the biopsies in 3 weeks.  Please also call your gastroenterologist's office with any specific questions about appointments or follow up tests. Followup appointment with Dr. Madilyn FiremanHayes in 3-4 weeks

## 2014-03-28 NOTE — Anesthesia Preprocedure Evaluation (Addendum)
Anesthesia Evaluation  Patient identified by MRN, date of birth, ID band Patient awake    Reviewed: Allergy & Precautions, H&P , NPO status , Patient's Chart, lab work & pertinent test results  Airway Mallampati: II TM Distance: >3 FB Neck ROM: full    Dental  (+) Missing, Dental Advisory Given, Poor Dentition Almost all of front teeth are missing:   Pulmonary neg pulmonary ROS, Current Smoker,  breath sounds clear to auscultation  Pulmonary exam normal       Cardiovascular Exercise Tolerance: Good negative cardio ROS  Rhythm:regular Rate:Normal     Neuro/Psych Anxiety Depression Bipolar Disorder negative neurological ROS  negative psych ROS   GI/Hepatic negative GI ROS, Neg liver ROS, GERD-  Medicated and Controlled,  Endo/Other  negative endocrine ROSHypothyroidism Borderline diabetic  Renal/GU negative Renal ROS  negative genitourinary   Musculoskeletal   Abdominal   Peds  Hematology negative hematology ROS (+)   Anesthesia Other Findings   Reproductive/Obstetrics negative OB ROS                          Anesthesia Physical Anesthesia Plan  ASA: II  Anesthesia Plan: MAC   Post-op Pain Management:    Induction:   Airway Management Planned:   Additional Equipment:   Intra-op Plan:   Post-operative Plan:   Informed Consent: I have reviewed the patients History and Physical, chart, labs and discussed the procedure including the risks, benefits and alternatives for the proposed anesthesia with the patient or authorized representative who has indicated his/her understanding and acceptance.   Dental Advisory Given  Plan Discussed with: CRNA and Surgeon  Anesthesia Plan Comments:         Anesthesia Quick Evaluation

## 2014-03-28 NOTE — Addendum Note (Signed)
Addended by: Dorena CookeyHAYES, Bladyn Tipps on: 03/28/2014 08:01 AM   Modules accepted: Orders

## 2014-03-29 ENCOUNTER — Encounter (HOSPITAL_COMMUNITY): Payer: Self-pay | Admitting: Gastroenterology

## 2014-05-01 ENCOUNTER — Emergency Department (HOSPITAL_COMMUNITY): Payer: Self-pay

## 2014-05-01 ENCOUNTER — Observation Stay (HOSPITAL_COMMUNITY)
Admission: EM | Admit: 2014-05-01 | Discharge: 2014-05-03 | Disposition: A | Discharge status: Home / Self Care | Payer: Self-pay | Attending: Internal Medicine | Admitting: Internal Medicine

## 2014-05-01 ENCOUNTER — Encounter (HOSPITAL_COMMUNITY): Payer: Self-pay | Admitting: Emergency Medicine

## 2014-05-01 ENCOUNTER — Observation Stay (HOSPITAL_COMMUNITY): Payer: Self-pay

## 2014-05-01 DIAGNOSIS — M19011 Primary osteoarthritis, right shoulder: Secondary | ICD-10-CM | POA: Insufficient documentation

## 2014-05-01 DIAGNOSIS — R55 Syncope and collapse: Secondary | ICD-10-CM | POA: Diagnosis present

## 2014-05-01 DIAGNOSIS — Z880 Allergy status to penicillin: Secondary | ICD-10-CM | POA: Insufficient documentation

## 2014-05-01 DIAGNOSIS — F319 Bipolar disorder, unspecified: Secondary | ICD-10-CM | POA: Insufficient documentation

## 2014-05-01 DIAGNOSIS — G458 Other transient cerebral ischemic attacks and related syndromes: Secondary | ICD-10-CM

## 2014-05-01 DIAGNOSIS — G459 Transient cerebral ischemic attack, unspecified: Principal | ICD-10-CM | POA: Insufficient documentation

## 2014-05-01 DIAGNOSIS — E039 Hypothyroidism, unspecified: Secondary | ICD-10-CM | POA: Insufficient documentation

## 2014-05-01 DIAGNOSIS — E119 Type 2 diabetes mellitus without complications: Secondary | ICD-10-CM

## 2014-05-01 DIAGNOSIS — K859 Acute pancreatitis, unspecified: Secondary | ICD-10-CM | POA: Insufficient documentation

## 2014-05-01 DIAGNOSIS — F329 Major depressive disorder, single episode, unspecified: Secondary | ICD-10-CM | POA: Insufficient documentation

## 2014-05-01 DIAGNOSIS — K219 Gastro-esophageal reflux disease without esophagitis: Secondary | ICD-10-CM | POA: Insufficient documentation

## 2014-05-01 DIAGNOSIS — F17210 Nicotine dependence, cigarettes, uncomplicated: Secondary | ICD-10-CM | POA: Insufficient documentation

## 2014-05-01 DIAGNOSIS — M170 Bilateral primary osteoarthritis of knee: Secondary | ICD-10-CM | POA: Insufficient documentation

## 2014-05-01 DIAGNOSIS — M19012 Primary osteoarthritis, left shoulder: Secondary | ICD-10-CM | POA: Insufficient documentation

## 2014-05-01 DIAGNOSIS — G629 Polyneuropathy, unspecified: Secondary | ICD-10-CM | POA: Insufficient documentation

## 2014-05-01 DIAGNOSIS — M160 Bilateral primary osteoarthritis of hip: Secondary | ICD-10-CM | POA: Insufficient documentation

## 2014-05-01 DIAGNOSIS — D649 Anemia, unspecified: Secondary | ICD-10-CM | POA: Insufficient documentation

## 2014-05-01 DIAGNOSIS — M6289 Other specified disorders of muscle: Secondary | ICD-10-CM

## 2014-05-01 DIAGNOSIS — F419 Anxiety disorder, unspecified: Secondary | ICD-10-CM | POA: Insufficient documentation

## 2014-05-01 DIAGNOSIS — Z888 Allergy status to other drugs, medicaments and biological substances status: Secondary | ICD-10-CM | POA: Insufficient documentation

## 2014-05-01 DIAGNOSIS — R531 Weakness: Secondary | ICD-10-CM | POA: Insufficient documentation

## 2014-05-01 DIAGNOSIS — R4781 Slurred speech: Secondary | ICD-10-CM | POA: Insufficient documentation

## 2014-05-01 DIAGNOSIS — Z79899 Other long term (current) drug therapy: Secondary | ICD-10-CM | POA: Insufficient documentation

## 2014-05-01 LAB — CBC WITH DIFFERENTIAL/PLATELET
BASOS PCT: 0 % (ref 0–1)
Basophils Absolute: 0 10*3/uL (ref 0.0–0.1)
EOS ABS: 0.1 10*3/uL (ref 0.0–0.7)
EOS PCT: 1 % (ref 0–5)
HEMATOCRIT: 46.6 % (ref 39.0–52.0)
Hemoglobin: 15.6 g/dL (ref 13.0–17.0)
Lymphocytes Relative: 29 % (ref 12–46)
Lymphs Abs: 3 10*3/uL (ref 0.7–4.0)
MCH: 27.5 pg (ref 26.0–34.0)
MCHC: 33.5 g/dL (ref 30.0–36.0)
MCV: 82.2 fL (ref 78.0–100.0)
MONO ABS: 0.6 10*3/uL (ref 0.1–1.0)
Monocytes Relative: 6 % (ref 3–12)
NEUTROS PCT: 64 % (ref 43–77)
Neutro Abs: 6.5 10*3/uL (ref 1.7–7.7)
Platelets: 245 10*3/uL (ref 150–400)
RBC: 5.67 MIL/uL (ref 4.22–5.81)
RDW: 13.4 % (ref 11.5–15.5)
WBC: 10.2 10*3/uL (ref 4.0–10.5)

## 2014-05-01 LAB — GLUCOSE, CAPILLARY: Glucose-Capillary: 141 mg/dL — ABNORMAL HIGH (ref 70–99)

## 2014-05-01 LAB — RAPID URINE DRUG SCREEN, HOSP PERFORMED
Amphetamines: NOT DETECTED
BARBITURATES: NOT DETECTED
Benzodiazepines: NOT DETECTED
COCAINE: NOT DETECTED
Opiates: NOT DETECTED
TETRAHYDROCANNABINOL: POSITIVE — AB

## 2014-05-01 LAB — URINALYSIS, ROUTINE W REFLEX MICROSCOPIC
Bilirubin Urine: NEGATIVE
Glucose, UA: NEGATIVE mg/dL
Hgb urine dipstick: NEGATIVE
Ketones, ur: NEGATIVE mg/dL
LEUKOCYTES UA: NEGATIVE
Nitrite: NEGATIVE
PROTEIN: NEGATIVE mg/dL
Specific Gravity, Urine: 1.01 (ref 1.005–1.030)
UROBILINOGEN UA: 0.2 mg/dL (ref 0.0–1.0)
pH: 5.5 (ref 5.0–8.0)

## 2014-05-01 LAB — COMPREHENSIVE METABOLIC PANEL
ALBUMIN: 3.4 g/dL — AB (ref 3.5–5.2)
ALK PHOS: 46 U/L (ref 39–117)
ALT: 15 U/L (ref 0–53)
AST: 21 U/L (ref 0–37)
Anion gap: 13 (ref 5–15)
BUN: 7 mg/dL (ref 6–23)
CALCIUM: 9 mg/dL (ref 8.4–10.5)
CO2: 23 mEq/L (ref 19–32)
Chloride: 102 mEq/L (ref 96–112)
Creatinine, Ser: 0.84 mg/dL (ref 0.50–1.35)
GFR calc non Af Amer: >90 mL/min (ref >90)
GLUCOSE: 93 mg/dL (ref 70–99)
POTASSIUM: 3.9 meq/L (ref 3.7–5.3)
SODIUM: 138 meq/L (ref 137–147)
TOTAL PROTEIN: 6.1 g/dL (ref 6.0–8.3)
Total Bilirubin: 0.3 mg/dL (ref 0.3–1.2)

## 2014-05-01 LAB — ETHANOL

## 2014-05-01 LAB — TROPONIN I: Troponin I: <0.3 ng/mL (ref <0.30)

## 2014-05-01 LAB — CBG MONITORING, ED: Glucose-Capillary: 102 mg/dL — ABNORMAL HIGH (ref 70–99)

## 2014-05-01 MED ORDER — OXCARBAZEPINE 300 MG PO TABS
300.0000 mg | ORAL_TABLET | Freq: Two times a day (BID) | ORAL | Status: DC
  Administered 2014-05-01 – 2014-05-03 (×4): 300 mg via ORAL
  Filled 2014-05-01 (×5): qty 1

## 2014-05-01 MED ORDER — GABAPENTIN 400 MG PO CAPS
400.0000 mg | ORAL_CAPSULE | Freq: Three times a day (TID) | ORAL | Status: DC
  Administered 2014-05-01 – 2014-05-03 (×5): 400 mg via ORAL
  Filled 2014-05-01 (×7): qty 1

## 2014-05-01 MED ORDER — ALPRAZOLAM 0.5 MG PO TABS
0.5000 mg | ORAL_TABLET | Freq: Once | ORAL | Status: AC
  Administered 2014-05-01: 0.5 mg via ORAL
  Filled 2014-05-01: qty 1

## 2014-05-01 MED ORDER — INSULIN ASPART 100 UNIT/ML ~~LOC~~ SOLN: 0.0000 [IU] | Freq: Every day | SUBCUTANEOUS | Status: DC

## 2014-05-01 MED ORDER — LORATADINE 10 MG PO TABS
10.0000 mg | ORAL_TABLET | Freq: Every day | ORAL | Status: DC
  Administered 2014-05-01 – 2014-05-02 (×2): 10 mg via ORAL
  Filled 2014-05-01 (×3): qty 1

## 2014-05-01 MED ORDER — ASPIRIN 325 MG PO TABS
325.0000 mg | ORAL_TABLET | Freq: Every day | ORAL | Status: DC
  Administered 2014-05-02 – 2014-05-03 (×2): 325 mg via ORAL
  Filled 2014-05-01 (×2): qty 1

## 2014-05-01 MED ORDER — INFLUENZA VAC SPLIT QUAD 0.5 ML IM SUSY
0.5000 mL | PREFILLED_SYRINGE | INTRAMUSCULAR | Status: AC
  Administered 2014-05-02: 0.5 mL via INTRAMUSCULAR
  Filled 2014-05-01 (×2): qty 0.5

## 2014-05-01 MED ORDER — PANTOPRAZOLE SODIUM 40 MG PO TBEC
40.0000 mg | DELAYED_RELEASE_TABLET | Freq: Every day | ORAL | Status: DC
Start: 2014-05-02 — End: 2014-05-03
  Administered 2014-05-02 – 2014-05-03 (×2): 40 mg via ORAL
  Filled 2014-05-01 (×2): qty 1

## 2014-05-01 MED ORDER — ACETAMINOPHEN 325 MG PO TABS
650.0000 mg | ORAL_TABLET | ORAL | Status: DC | PRN
  Administered 2014-05-02: 650 mg via ORAL
  Filled 2014-05-01: qty 2

## 2014-05-01 MED ORDER — TRAZODONE HCL 50 MG PO TABS
50.0000 mg | ORAL_TABLET | Freq: Every day | ORAL | Status: DC
  Administered 2014-05-01 – 2014-05-02 (×2): 50 mg via ORAL
  Filled 2014-05-01 (×3): qty 1

## 2014-05-01 MED ORDER — INSULIN ASPART 100 UNIT/ML ~~LOC~~ SOLN
0.0000 [IU] | Freq: Three times a day (TID) | SUBCUTANEOUS | Status: DC
Start: 2014-05-02 — End: 2014-05-03
  Administered 2014-05-02: 3 [IU] via SUBCUTANEOUS
  Administered 2014-05-02: 2 [IU] via SUBCUTANEOUS

## 2014-05-01 MED ORDER — TRAMADOL HCL 50 MG PO TABS
50.0000 mg | ORAL_TABLET | Freq: Four times a day (QID) | ORAL | Status: DC | PRN
  Administered 2014-05-03: 50 mg via ORAL
  Filled 2014-05-01: qty 1

## 2014-05-01 MED ORDER — GADOBENATE DIMEGLUMINE 529 MG/ML IV SOLN
20.0000 mL | Freq: Once | INTRAVENOUS | Status: AC | PRN
  Administered 2014-05-01: 20 mL via INTRAVENOUS

## 2014-05-01 MED ORDER — BUSPIRONE HCL 5 MG PO TABS
5.0000 mg | ORAL_TABLET | Freq: Every day | ORAL | Status: DC
  Administered 2014-05-02 – 2014-05-03 (×2): 5 mg via ORAL
  Filled 2014-05-01 (×2): qty 1

## 2014-05-01 MED ORDER — SODIUM CHLORIDE 0.9 % IV SOLN
INTRAVENOUS | Status: AC
  Administered 2014-05-01: 23:00:00 via INTRAVENOUS

## 2014-05-01 MED ORDER — SODIUM CHLORIDE 0.9 % IV BOLUS (SEPSIS)
500.0000 mL | Freq: Once | INTRAVENOUS | Status: AC
  Administered 2014-05-01: 500 mL via INTRAVENOUS

## 2014-05-01 MED ORDER — STROKE: EARLY STAGES OF RECOVERY BOOK
Freq: Once | Status: AC
  Administered 2014-05-01: 23:00:00
  Filled 2014-05-01: qty 1

## 2014-05-01 MED ORDER — BACLOFEN 10 MG PO TABS
10.0000 mg | ORAL_TABLET | Freq: Three times a day (TID) | ORAL | Status: DC
  Administered 2014-05-01 – 2014-05-03 (×5): 10 mg via ORAL
  Filled 2014-05-01 (×7): qty 1

## 2014-05-01 NOTE — ED Notes (Signed)
Gave sandwich and sprite
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
## 2014-05-01 NOTE — ED Notes (Signed)
cbg 102 

## 2014-05-01 NOTE — ED Notes (Signed)
Patient transported to CT 

## 2014-05-01 NOTE — ED Notes (Signed)
Patient is being transported to MRI. Debbie, Diplomatic Services operational officersecretary has notified 4th floor of the delay of patient coming upstairs.

## 2014-05-01 NOTE — ED Notes (Signed)
Pt seemingly passed out for about 10 seconds when laid completely flat while doing orthostatics. Did sternal rub and became conscious again after sternal rub 2-3 seconds. Very dizzy when sitting and standing up and extremely disoriented. Knows that he is at the hospital but patient states "I don' know what' going on right now. I'm at the hospital it seems" But does remember this writers name.

## 2014-05-01 NOTE — ED Notes (Signed)
Patient transported to X-ray 

## 2014-05-01 NOTE — ED Notes (Signed)
Brought in by EMS from pt's PCP's office after his syncopal episode.  Pt reported that he got dizzy and fell, apparently lost his consciousness because he was awakened being "externally rubbed in chest".  Pt fully awake on EMS' arrival.  Pt reports hitting head, now complaining of 4/10 headache. VS by EMS: BP126/72, P86, O2 sat 97% on RA, CBG103, R18.

## 2014-05-01 NOTE — ED Notes (Signed)
Bed: [id number] Expected date:  Expected time:  Means of arrival:  Comments: EMS - syncopal episode

## 2014-05-01 NOTE — ED Notes (Signed)
Pt unable to tolerate orthostatic vital signs at this time--- pt got extremely dizzy on position change.
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
## 2014-05-02 ENCOUNTER — Observation Stay (HOSPITAL_COMMUNITY): Payer: Self-pay

## 2014-05-02 DIAGNOSIS — F3160 Bipolar disorder, current episode mixed, unspecified: Secondary | ICD-10-CM

## 2014-05-02 DIAGNOSIS — R55 Syncope and collapse: Secondary | ICD-10-CM

## 2014-05-02 DIAGNOSIS — F329 Major depressive disorder, single episode, unspecified: Secondary | ICD-10-CM

## 2014-05-02 DIAGNOSIS — F488 Other specified nonpsychotic mental disorders: Secondary | ICD-10-CM

## 2014-05-02 LAB — LIPID PANEL
Cholesterol: 168 mg/dL (ref 0–200)
HDL: 25 mg/dL — ABNORMAL LOW (ref >39)
LDL Cholesterol: 110 mg/dL — ABNORMAL HIGH (ref 0–99)
Total CHOL/HDL Ratio: 6.7 RATIO
Triglycerides: 167 mg/dL — ABNORMAL HIGH (ref <150)
VLDL: 33 mg/dL (ref 0–40)

## 2014-05-02 LAB — GLUCOSE, CAPILLARY
GLUCOSE-CAPILLARY: 125 mg/dL — AB (ref 70–99)
Glucose-Capillary: 175 mg/dL — ABNORMAL HIGH (ref 70–99)
Glucose-Capillary: 109 mg/dL — ABNORMAL HIGH (ref 70–99)
Glucose-Capillary: 127 mg/dL — ABNORMAL HIGH (ref 70–99)

## 2014-05-02 LAB — TROPONIN I
Troponin I: <0.3 ng/mL (ref <0.30)
Troponin I: <0.3 ng/mL (ref <0.30)

## 2014-05-02 LAB — URINE CULTURE
Colony Count: NO GROWTH
Culture: NO GROWTH

## 2014-05-02 LAB — HEMOGLOBIN A1C
Hgb A1c MFr Bld: 5.7 % — ABNORMAL HIGH (ref <5.7)
Mean Plasma Glucose: 117 mg/dL — ABNORMAL HIGH (ref <117)

## 2014-05-02 LAB — TSH: TSH: 1.63 u[IU]/mL (ref 0.350–4.500)

## 2014-05-02 MED ORDER — SODIUM CHLORIDE 0.9 % IV SOLN
INTRAVENOUS | Status: AC
Start: 2014-05-02 — End: 2014-05-03
  Administered 2014-05-02: 16:00:00 via INTRAVENOUS

## 2014-05-02 MED ORDER — NICOTINE 14 MG/24HR TD PT24
14.0000 mg | MEDICATED_PATCH | Freq: Every day | TRANSDERMAL | Status: DC
Start: 2014-05-02 — End: 2014-05-03
  Administered 2014-05-02 – 2014-05-03 (×2): 14 mg via TRANSDERMAL
  Filled 2014-05-02 (×2): qty 1

## 2014-05-02 NOTE — Progress Notes (Signed)
UR completed 

## 2014-05-02 NOTE — Progress Notes (Signed)
VASCULAR LAB PRELIMINARY  PRELIMINARY  PRELIMINARY  PRELIMINARY  Carotid Dopplers completed.    Preliminary report:  1-39% ICA stenosis, lowest end of scale.  Vertebral artery flow is antegrade.  Meliton Samad, RVT 05/02/2014, 9:26 AM
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
## 2014-05-02 NOTE — Progress Notes (Signed)
Patient ID: Alex Turner, male   DOB: 08/29/1970, 43 y.o.   MRN: [id number] [facility] PROGRESS NOTE  Alex BroadDavid M Kamer QMV:[id number] DOB: 06/09/1971 DOA: 05/01/2014 PCP: Dartha LodgeSTEELE,ANTHONY, FNP  Brief narrative: 43 y.o. male with past medical history of borderline personality disorder, bipolar, anxiety, depression, diabetes who [facility] with complaints of sudden onset confusion, slurred speach and lower extremity weakness left more than right. He reported he nearly passed out. Pt does not recall having similar episodes in past. By now, his symptoms have mostly resolved except for LE weakness. CT head and MRI brain essentially unremarkable.  Assessment/Plan:    Active Problems: TIA (transient ischemic attack) - CT head and MRI brain all essentially unremarkable - follow u 2 D ECHO, carotid doppler - per PT eval - SNF recommended - continue aspirin  - fasting lipid panel revealed TGC 137, HDL 25 and LDL 110 - A1c is pending; since diabetic goal LDL is less than 70. Will add low dose statin therapy  - BP less than 135/85 (At goal) - EEG not yet done - appreciate neurology following Probable new diabetes - follow up A1c level - on SSI Neuropathy  - continue gabapentin 400 mg PO TID Bipolar disorder, depression - resumed home meds   DVT Prophylaxis    Code Status: Full.  Family Communication:  plan of care discussed with the patient Disposition Plan: per PT, SNF recommended   IV access:   PeripheralIV  Procedures and diagnostic studies:    Dg Chest 2 View 05/01/2014  No active cardiopulmonary disease.     Ct Head Wo Contrast 05/01/2014  No acute intracranial abnormality noted.   Electronically Signed   By: Alcide CleverMark  Lukens M.D.   On: 05/01/2014 17:06   Mr Maxine GlennMra Head Wo Contrast 05/01/2014     Normal MRA of the intracranial circulation     Mr Brain Wo Contrast 05/01/2014    Normal MRA of the intracranial circulation    Mr Cervical Spine W Wo Contrast  05/01/2014    1. No MRI evidence for acute traumatic injury within the cervical spine. 2. Degenerative disc disease at C5-6 with resultant moderate right and mild left foraminal narrowing, with mild central canal stenosis. 3. Mild degenerative disc disease at C6-7 with resultant moderate left with mild right foraminal narrowing.     Medical Consultants:   Neurology  Other Consultants:   Physical therapy  IAnti-Infectives:    None    Manson PasseyEVINE, Ahna Konkle, MD  [facility] Pager [phone]  If 7PM-7AM, please contact night-coverage [email or web address] Password [email or web address] 05/02/2014, 2:31 PM   LOS: 1 day    HPI/Subjective: No acute overnight events.  Objective: Filed Vitals:   05/02/14 1051 05/02/14 1053 05/02/14 1055 05/02/14 1426  BP: 124/66 124/75 135/77 123/70  Pulse: 73 61 85 69  Temp: 98.6 F (37 C)   98.1 F (36.7 C)  TempSrc: Oral   Oral  Resp: 19   20  Height:      Weight:      SpO2: 99%   97%    Intake/Output Summary (Last 24 hours) at 05/02/14 1431 Last data filed at 05/02/14 1426  Gross per 24 hour  Intake   1220 ml  Output   2200 ml  Net   -980 ml    Exam:   General:  Pt is alert, follows commands appropriately, not in acute distress  Cardiovascular: Regular rate and rhythm, S1/S2, no murmurs  Respiratory: Clear to auscultation bilaterally, no wheezing,  no crackles, no rhonchi  Abdomen: Soft, non tender, non distended, bowel sounds present  Extremities: No edema, pulses DP and PT palpable bilaterally  Neuro: non focal  Data Reviewed: Basic Metabolic Panel:  Recent Labs Lab 05/01/14 1604  NA 138  K 3.9  CL 102  CO2 23  GLUCOSE 93  BUN 7  CREATININE 0.84  CALCIUM 9.0   Liver Function Tests:  Recent Labs Lab 05/01/14 1604  AST 21  ALT 15  ALKPHOS 46  BILITOT 0.3  PROT 6.1  ALBUMIN 3.4*   No results for input(s): LIPASE, AMYLASE in the last 168 hours. No results for input(s): AMMONIA in the last 168 hours. CBC:  Recent Labs Lab  05/01/14 1604  WBC 10.2  NEUTROABS 6.5  HGB 15.6  HCT 46.6  MCV 82.2  PLT 245   Cardiac Enzymes:  Recent Labs Lab 05/01/14 2036 05/02/14 0400 05/02/14 0945  TROPONINI <0.30 <0.30 <0.30   BNP: Invalid input(s): POCBNP CBG:  Recent Labs Lab 05/01/14 1458 05/01/14 2347 05/02/14 0726 05/02/14 1141  GLUCAP 102* 141* 175* 109*    No results found for this or any previous visit (from the past 240 hour(s)).   Scheduled Meds: . aspirin  325 mg Oral Daily  . baclofen  10 mg Oral TID  . busPIRone  5 mg Oral Daily  . gabapentin  400 mg Oral TID  . insulin aspart  0-15 Units Subcutaneous TID WC  . insulin aspart  0-5 Units Subcutaneous QHS  . loratadine  10 mg Oral QHS  . nicotine  14 mg Transdermal Daily  . Oxcarbazepine  300 mg Oral BID  . pantoprazole  40 mg Oral Daily  . traZODone  50 mg Oral QHS

## 2014-05-02 NOTE — Progress Notes (Signed)
Rn paged this NP secondary to pt "blacking out" during orthostatic vitals. NT was in room taking vitals and pt blacked out while lying down. Sitting and standing vitals were never taken. Pt awakened within seconds and was neurologically intact. Continues to have numbness UE (chronic). BP 110s. No focal neuro findings. MRI/MRA on admission neg. Neuro will see this am. Other tests/workup still pending. Pt did not fall.  Craige CottaKirby, NP

## 2014-05-02 NOTE — Progress Notes (Signed)
EEG Completed; Results Pending  

## 2014-05-02 NOTE — Progress Notes (Signed)
OT Cancellation Note  Patient Details Name: Suella BroadDavid M Lizama MRN: [id number] DOB: 06/14/1971   Cancelled Treatment:    Reason Eval/Treat Not Completed: Patient at procedure or test/ unavailable. Will re check on pt later in day or next day.  Alba CoryREDDING, Almina Schul D 05/02/2014, 12:09 PM

## 2014-05-02 NOTE — Progress Notes (Signed)
This morning, while NT was taking patient's orthostatic vitals, patient blacked out for a second while the lying vitals were being taken. BP was 111/69. Patient came to quickly without needing to be aroused. He did have confusion when he first woke up. Patient also stated his left arm felt more numb then normal. NP made aware and neurologist to see patient in the morning. Standing orthos were not done due to patient's dizziness.

## 2014-05-02 NOTE — Evaluation (Signed)
Physical Therapy Evaluation Patient Details Name: Alex BroadDavid M Turner MRN: [id number] DOB: 09/16/1970 Today's Date: 05/02/2014   History of Present Illness  43 yo male admitted with syncopal epidsode, weakness. Hx of bipolar d/o, DM.  Clinical Impression  On eval, pt required Min assist +2 for mobility-able to walk a short distance ~15 feet with RW. Very unsteady gait. Pt reports weakness and impaired balance. Recommend SNF for rehab depending on progress/resolution of symptoms, signs.     Follow Up Recommendations SNF;Supervision/Assistance - 24 hour    Equipment Recommendations  Rolling walker with 5" wheels (possibly)    Recommendations for Other Services OT consult     Precautions / Restrictions Precautions Precautions: Fall Restrictions Weight Bearing Restrictions: No      Mobility  Bed Mobility Overal bed mobility: Modified Independent Bed Mobility: Supine to Sit     Supine to sit: Min assist        Transfers Overall transfer level: Needs assistance Equipment used: None;Rolling walker (2 wheeled) Transfers: Sit to/from Stand Sit to Stand: Min assist;+2 safety/equipment;+2 physical assistance         General transfer comment: Stood multiple times-pt would stand for ~10-20 seconds then request to sit down. Anterior-posterior and lateral swaing observed. Also noted pt to be holding L foot up off of floor during standing-seemed odd especially since pt was c/o having balance issues.  Ambulation/Gait Ambulation/Gait assistance: Min assist;+2 physical assistance;+2 safety/equipment Ambulation Distance (Feet): 15 Feet Assistive device: Rolling walker (2 wheeled) Gait Pattern/deviations: Decreased stride length;Trunk flexed;Drifts right/left;Staggering right;Staggering left;Step-to pattern     General Gait Details: Step to pattern for increased safety, stability. Assist needed to support pt. L knee did not appear to buckle during short distance. Ambulation appeared  effortful for pt. Pt declined to walk any farther.   Stairs            Wheelchair Mobility    Modified Rankin (Stroke Patients Only)       Balance Overall balance assessment: Needs assistance Sitting-balance support: No upper extremity supported Sitting balance-Leahy Scale: Good     Standing balance support: No upper extremity supported Standing balance-Leahy Scale: Poor                               Pertinent Vitals/Pain Pain Assessment: No/denies pain    Home Living Family/patient expects to be discharged to:: Private residence Living Arrangements: Parent Available Help at Discharge: Family Type of Home: House       Home Layout: One level;Other (Comment) (1 step in house to get in den) Home Equipment: Gilmer Morane - single point;Crutches;Walker - standard      Prior Function Level of Independence: Independent         Comments: pt doesnt drive      Hand Dominance        Extremity/Trunk Assessment   Upper Extremity Assessment: Defer to OT evaluation       LUE Deficits / Details: pt reports numbness LUE - entire arm per pt. With strength testing R a bit stronger than right- but unsure if pt trying for LUE to be weaker   Lower Extremity Assessment: RLE deficits/detail;LLE deficits/detail RLE Deficits / Details: WFL LLE Deficits / Details: During MMT, strength at least3+/5 however not sure if pt is not putting forth full effort  Cervical / Trunk Assessment: Normal  Communication   Communication: No difficulties  Cognition Arousal/Alertness: Awake/alert Behavior During Therapy: WFL for tasks assessed/performed Overall Cognitive  Status: Within Functional Limits for tasks assessed                      General Comments      Exercises        Assessment/Plan    PT Assessment Patient needs continued PT services  PT Diagnosis Difficulty walking;Abnormality of gait   PT Problem List Decreased strength;Decreased range of  motion;Decreased balance;Decreased activity tolerance;Decreased mobility;Decreased knowledge of use of DME;Decreased coordination  PT Treatment Interventions DME instruction;Gait training;Functional mobility training;Therapeutic activities;Therapeutic exercise;Patient/family education;Balance training   PT Goals (Current goals can be found in the Care Plan section) Acute Rehab PT Goals Patient Stated Goal: to get better PT Goal Formulation: With patient Time For Goal Achievement: 05/16/14 Potential to Achieve Goals: Good    Frequency Min 3X/week   Barriers to discharge        Co-evaluation               End of Session Equipment Utilized During Treatment: Gait belt Activity Tolerance: Patient limited by fatigue Patient left: in bed;with call bell/phone within reach;with bed alarm set           Time: [id number] PT Time Calculation (min) (ACUTE ONLY): 14 min   Charges:   PT Evaluation $Initial PT Evaluation Tier I: 1 Procedure PT Treatments $Gait Training: 8-22 mins   PT G Codes:          Rebeca AlertJannie Marilin Kofman, MPT Pager: [phone]

## 2014-05-02 NOTE — Plan of Care (Signed)
Problem: Consults Goal: Skin Care Protocol Initiated - if Braden Score 18 or less If consults are not indicated, leave blank or document N/A  Outcome: Not Applicable Date Met:  16/10/96 Goal: Nutrition Consult-if indicated Outcome: Completed/Met Date Met:  05/02/14 Goal: Diabetes Guidelines if Diabetic/Glucose > 140 If diabetic or lab glucose is > 140 mg/dl - Initiate Diabetes/Hyperglycemia Guidelines & Document Interventions  Outcome: Completed/Met Date Met:  05/02/14  Problem: tPA Day Progression Outcomes-Only if tPA administered Goal: Inclusion criteria for tPA STANDARD: < 3hours from symptoms onset: - Diagnosis of ischemic stroke causing measureable neurological deficit - Neurological signs shold not be minor & isolated - Symptoms of stroke should not be sugestive of SAH - No head trauma or prior stroke in previous 3 months - No gastrointestinal or urinary tract hemorrhage in previous 21 days - No arterial puncture at a noncompressible site in previous 7 days - BP not elevated [systolic <045 mmHg, diastolic < 409 mmHg] - Not taking oral anticoagulant or if being taken INR < or equal 1.7 - Platelet count > or equal 100,000 mm3 - No seizure w/postictal residual neurological impairments - Pt/family understand potential risks/benefits from treatment - Caution in pts with NIHSS > 22 - Seizure at stroke onset eligible for tPA if residual impairments due to stroke and not the seizures. - Neurological signs should not be clearing spontaneously - Exercise caution in treating pt with major deficits - Symptoms onset < 3hrs before beginning treatment - No MI in previous 3 months - No major surgery in previous 14 days - No history previous intracranial hemorrhage - No evidence active bleeding/acute trauma (fracture) on examinations - If receiving heparin in previous 48 hrs, aPTT must be normal range - Abnormal Blood Glucose < 50 or > 400 mg/dL - CT does not show multilobar infarction  [hypodensity >1/3 cerebral hemisphere] EXTENDED: 3-4.5 hrous from symptom onset - Age > 80 years - History of prior stroke and diabetes - Any anticoagulant use prior to admission (even if INR < 1.7) - NIHSS > 25"  Outcome: Not Applicable Date Met:  81/19/14 Goal: Pre tPA NIH Stroke Scale documented Outcome: Not Applicable Date Met:  78/29/56 Goal: Pre tPA BP < 185/110 - see orders for mgmt Outcome: Not Applicable Date Met:  21/30/86 Goal: Pre tPA two IV sites established Outcome: Not Applicable Date Met:  57/84/69 Goal: Pre tPA foley catheter inserted Outcome: Not Applicable Date Met:  62/95/28 Goal: Pre tPA panda/NGT inserted if need anticipated Outcome: Not Applicable Date Met:  41/32/44 Goal: Post tPA no anticoagulants/antiplatelets 24 hrs Outcome: Not Applicable Date Met:  06/22/70 Goal: Post tPA VS, neuro checks Outcome: Not Applicable Date Met:  53/66/44 Goal: Post tPA keep BP </equal 180/105 - see orders for mgmt Outcome: Not Applicable Date Met:  03/47/42 Goal: Post tPA call MD if neuro decline - plan for STAT CT Outcome: Not Applicable Date Met:  59/56/38 Goal: Post tPA CT brain w/o contrast 24 hrs post tPA Outcome: Not Applicable Date Met:  75/64/33 Goal: Post tPA bedrest for 24 hours Outcome: Not Applicable Date Met:  29/51/88 Goal: Post tPA fall precautions Outcome: Not Applicable Date Met:  41/66/06 Goal: Post tPA monitor for S/S of bleeding Outcome: Not Applicable Date Met:  30/16/01 Goal: Post tPA image without hemorrhage Outcome: Not Applicable Date Met:  09/32/35 Goal: Post tPA no S/S of hemorrhage elsewhere Outcome: Not Applicable Date Met:  57/32/20 Goal: Post tPA neurologically at baseline or improved CRITERIA FOR NEUROLOGICALLY AT BASELINE OR  IMPROVED: - NO S/S OF INC. ICP - AWAKE, ALERT, ORIENTED X3 - SPEECH CLEAR, APPROPRIATE - PERRL - EOMS, BLINK INTACT - FACE SYMMETRICAL - TONGUE/TRACH MIDLINE - GRIPS, PUSH/PULL EQUAL - NO PRONATOR DRIFT -  DORSIPLANTAR FLEXION EQUAL - MOVES ALL EXTREMITIES - SENSATION INTACT - NO NUCHAL RIGIDITY OR PHOTOPHOBIA  Outcome: Not Applicable Date Met:  73/54/30 Goal: Post tPA neuro decline/bleeding complications guide BLEEDING COMPLICATION GUIDE: - STOP TPA - NOTIFY MD ASAP - IF COMPRESSIBLE, HOLD DIRECT PRESSURE - IF NONCOMPRESSIBLE OR UNCONTROLLED BLEEDS, ANTICIPATE MD MAY ORDER STAT:  --- 5 UNITS CRYOPRECIPITATE,  --- 2-3 UNITS FFP IF NEEDED,  --- PLATELET TRANSFUSION IF NEEDED,  --- PRBCS IF NEEDED  Outcome: Not Applicable Date Met:  14/84/03 Goal: Other tPA Day Goals/Outcomes Outcome: Not Applicable Date Met:  97/95/36  Problem: Progression Outcomes Goal: Communication method established Outcome: Completed/Met Date Met:  05/02/14 Goal: If vent dependent, tolerates weaning Outcome: Not Applicable Date Met:  92/23/00 Goal: Progressive activity as tolerated Outcome: Progressing Goal: Tolerating diet/TF at goal rate Outcome: Progressing Goal: Pain controlled Outcome: Progressing Goal: Bowel & Bladder Continence Outcome: Completed/Met Date Met:  05/02/14 Goal: Initial discharge plan initiated Outcome: Progressing
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
## 2014-05-02 NOTE — Progress Notes (Signed)
  Echocardiogram 2D Echocardiogram has been performed.  Leta JunglingCooper, Lysle Yero M 05/02/2014, 12:24 PM

## 2014-05-02 NOTE — Progress Notes (Signed)
Workup has been completed. EEG and 2D echo both unremarkable. Suspect there is a strong functional overlay to his symptoms. No further in-patient neurological workup indicated at this time. Continue on ASA 325mg  daily and agree with plan to add low dose statin. He will need out-patient neurology follow up.  Please call with further questions. Thank you for the opportunity to help take care of your patient.   Elspeth Choeter Autry Prust, DO [facility] [phone]  If 7pm- 7am, please page neurology on call as listed in AMION.

## 2014-05-02 NOTE — Evaluation (Signed)
Occupational Therapy Evaluation Patient Details Name: Suella BroadDavid M Finazzo MRN: [id number] DOB: 01/26/1971 Today's Date: 05/02/2014    History of Present Illness Pt admitted s/p syncopal episode . Pt feels he has left sided weakness and numbness.     Clinical Impression   Pt admitted with syncope. Pt currently with functional limitations due to the deficits listed below (see OT Problem List).  Pt will benefit from skilled OT to increase their safety and independence with ADL and functional mobility for ADL to facilitate discharge to venue listed below.      Follow Up Recommendations  SNF    Equipment Recommendations  None recommended by OT    Recommendations for Other Services       Precautions / Restrictions Precautions Precautions: Fall Restrictions Weight Bearing Restrictions: No      Mobility Bed Mobility Overal bed mobility: Needs Assistance Bed Mobility: Supine to Sit     Supine to sit: Min assist        Transfers Overall transfer level: Needs assistance Equipment used: 1 person hand held assist Transfers: Sit to/from Stand Sit to Stand: Min assist         General transfer comment: pt had a hard time standing up initiallty- but then phone rang and pt able to stand and talk on phone.  Upon hanging up phone pt lost balance and sat quickly on bed    Balance Overall balance assessment: Needs assistance Sitting-balance support: No upper extremity supported Sitting balance-Leahy Scale: Good     Standing balance support: No upper extremity supported Standing balance-Leahy Scale: Fair                              ADL Overall ADL's : Needs assistance/impaired     Grooming: Set up;Sitting   Upper Body Bathing: Set up;Sitting   Lower Body Bathing: Sit to/from stand;Moderate assistance   Upper Body Dressing : Set up;Sitting   Lower Body Dressing: Sit to/from stand;Moderate assistance   Toilet Transfer: Moderate assistance Toilet Transfer  Details (indicate cue type and reason): sit to stand only Toileting- ArchitectClothing Manipulation and Hygiene: Sit to/from stand;Moderate assistance         General ADL Comments: pt willing to do what we need to do to get to get him better.  "pt is willing" to get on disability if that is what we suggest.  pt reported to OT he has had a TMI.  pt reports he is very physically limited- very weak on entire left side.       Vision       reports glasses are on order                       Pertinent Vitals/Pain Pain Assessment: No/denies pain     Hand Dominance     Extremity/Trunk Assessment Upper Extremity Assessment Upper Extremity Assessment: LUE deficits/detail LUE Deficits / Details: pt reports numbness LUE - entire arm per pt. With strength testing R a bit stronger than right- but unsure if pt trying for LUE to be weaker LUE Sensation: decreased light touch       Cervical / Trunk Assessment Cervical / Trunk Assessment: Normal   Communication Communication Communication: No difficulties   Cognition Arousal/Alertness: Awake/alert Behavior During Therapy: WFL for tasks assessed/performed Overall Cognitive Status: Within Functional Limits for tasks assessed  General Comments   pt inconsistent with presentation during OT eval            Home Living Family/patient expects to be discharged to:: Private residence Living Arrangements: Parent Available Help at Discharge: Family Type of Home: House       Home Layout: One level;Other (Comment) (1 step in house to get in den)     Bathroom Shower/Tub: Tub/shower unit KeySpanShower/tub characteristics: Curtain Bathroom Toilet: Handicapped height     Home Equipment: Medical laboratory scientific officerCane - single point;Crutches;Walker - standard          Prior Functioning/Environment Level of Independence: Independent        Comments: pt doesnt drive     OT Diagnosis: Generalized weakness   OT Problem List: Decreased  strength;Decreased activity tolerance;Decreased safety awareness;Impaired balance (sitting and/or standing)      OT Goals(Current goals can be found in the care plan section) ADL Goals Pt Will Perform Grooming: with supervision;sitting;standing Pt Will Perform Upper Body Dressing: sitting;Independently Pt Will Perform Lower Body Dressing: Independently;sit to/from stand Pt Will Transfer to Toilet: Independently;ambulating;regular height toilet Pt Will Perform Toileting - Clothing Manipulation and hygiene: with modified independence;sit to/from stand  OT Frequency:     Barriers to D/C:     pt reports he is home alone often and wants to do what he need to do to be I          End of Session Nurse Communication: Mobility status  Activity Tolerance: Patient tolerated treatment well Patient left: in bed   Time: 1225-1246 OT Time Calculation (min): 21 min Charges:  OT General Charges $OT Visit: 1 Procedure OT Evaluation $Initial OT Evaluation Tier I: 1 Procedure OT Treatments $Self Care/Home Management : 8-22 mins G-Codes: OT G-codes **NOT FOR INPATIENT CLASS** Functional Assessment Tool Used: clinical observation Functional Limitation: Self care Self Care Current Status (W0981(G8987): At least 40 percent but less than 60 percent impaired, limited or restricted Self Care Goal Status (X9147(G8988): At least 1 percent but less than 20 percent impaired, limited or restricted  Lucky Trotta, Metro KungLorraine D 05/02/2014, 1:06 PM

## 2014-05-02 NOTE — Procedures (Signed)
ELECTROENCEPHALOGRAM REPORT   Patient: Alex Turner       Room #: [id number] EEG No. ID: [id number] Age: 43 y.o.        Sex: male Referring Physician: Elisabeth Pigeonevine Report Date:  05/02/2014        Interpreting Physician: Thana FarrEYNOLDS,Kabe Mckoy D  History: Alex Turner is an 43 y.o. male with episodes of LUE shaking  Medications:  Scheduled: . aspirin  325 mg Oral Daily  . baclofen  10 mg Oral TID  . busPIRone  5 mg Oral Daily  . gabapentin  400 mg Oral TID  . insulin aspart  0-15 Units Subcutaneous TID WC  . insulin aspart  0-5 Units Subcutaneous QHS  . loratadine  10 mg Oral QHS  . nicotine  14 mg Transdermal Daily  . Oxcarbazepine  300 mg Oral BID  . pantoprazole  40 mg Oral Daily  . traZODone  50 mg Oral QHS    Conditions of Recording:  This is a 16 channel EEG carried out with the patient in the awake, drowsy and asleep states.  Description:  The waking background activity consists of a low voltage, symmetrical, fairly well organized, 10 Hz alpha activity, seen from the parieto-occipital and posterior temporal regions.  Low voltage fast activity, poorly organized, is seen anteriorly and is at times superimposed on more posterior regions.  A mixture of theta and alpha rhythms are seen from the central and temporal regions. The patient drowses with slowing to irregular, low voltage theta and beta activity.   The patient goes in to a light sleep with symmetrical sleep spindles, vertex central sharp transients and irregular slow activity.   Hyperventilation produced a mild to moderate buildup but failed to elicit any abnormalities.  Intermittent photic stimulation was performed but failed to illicit any change in the tracing.   IMPRESSION: Normal electroencephalogram, awake, asleep and with activation procedures. There are no focal lateralizing or epileptiform features.   Thana FarrLeslie Chelsie Burel, MD [facility] [phone] 05/02/2014, 5:51 PM

## 2014-05-02 NOTE — Progress Notes (Signed)
   05/02/14 1428  PT Time Calculation  PT Start Time (ACUTE ONLY) 1324  PT Stop Time (ACUTE ONLY) 1338  PT Time Calculation (min) (ACUTE ONLY) 14 min  PT G-Codes **NOT FOR INPATIENT CLASS**  Functional Assessment Tool Used (clinical judgement)  Functional Limitation Mobility: Walking and moving around  Mobility: Walking and Moving Around Current Status (Z6109(G8978) CJ  Mobility: Walking and Moving Around Goal Status (U0454(G8979) CI  PT General Charges  $$ ACUTE PT VISIT 1 Procedure  PT Evaluation  $Initial PT Evaluation Tier I 1 Procedure  PT Treatments  $Gait Training 8-22 mins   Rebeca AlertJannie Avis Tirone, MPT [phone]

## 2014-05-03 DIAGNOSIS — R4781 Slurred speech: Secondary | ICD-10-CM | POA: Insufficient documentation

## 2014-05-03 DIAGNOSIS — R55 Syncope and collapse: Secondary | ICD-10-CM | POA: Insufficient documentation

## 2014-05-03 LAB — VITAMIN B12: Vitamin B-12: 318 pg/mL (ref 211–911)

## 2014-05-03 LAB — GLUCOSE, CAPILLARY
GLUCOSE-CAPILLARY: 97 mg/dL (ref 70–99)
Glucose-Capillary: 110 mg/dL — ABNORMAL HIGH (ref 70–99)

## 2014-05-03 MED ORDER — POLYETHYLENE GLYCOL 3350 17 G PO PACK
17.0000 g | PACK | Freq: Every day | ORAL | Status: DC
  Administered 2014-05-03: 17 g via ORAL
  Filled 2014-05-03: qty 1

## 2014-05-03 MED ORDER — ASPIRIN EC 81 MG PO TBEC: 81.0000 mg | DELAYED_RELEASE_TABLET | Freq: Every day | ORAL | Status: DC
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
## 2014-05-03 NOTE — Discharge Instructions (Signed)
Weakness Weakness is a lack of strength. It may be felt all over the body (generalized) or in one specific part of the body (focal). Some causes of weakness can be serious. You may need further medical evaluation, especially if you are elderly or you have a history of immunosuppression (such as chemotherapy or HIV), kidney disease, heart disease, or diabetes. CAUSES  Weakness can be caused by many different things, including:  Infection.  Physical exhaustion.  Internal bleeding or other blood loss that results in a lack of red blood cells (anemia).  Dehydration. This cause is more common in elderly people.  Side effects or electrolyte abnormalities from medicines, such as pain medicines or sedatives.  Emotional distress, anxiety, or depression.  Circulation problems, especially severe peripheral arterial disease.  Heart disease, such as rapid atrial fibrillation, bradycardia, or heart failure.  Nervous system disorders, such as Guillain-Barr syndrome, multiple sclerosis, or stroke. DIAGNOSIS  To find the cause of your weakness, your caregiver will take your history and perform a physical exam. Lab tests or X-rays may also be ordered, if needed. TREATMENT  Treatment of weakness depends on the cause of your symptoms and can vary greatly. HOME CARE INSTRUCTIONS   Rest as needed.  Eat a well-balanced diet.  Try to get some exercise every day.  Only take over-the-counter or prescription medicines as directed by your caregiver. SEEK MEDICAL CARE IF:   Your weakness seems to be getting worse or spreads to other parts of your body.  You develop new aches or pains. SEEK IMMEDIATE MEDICAL CARE IF:   You cannot perform your normal daily activities, such as getting dressed and feeding yourself.  You cannot walk up and down stairs, or you feel exhausted when you do so.  You have shortness of breath or chest pain.  You have difficulty moving parts of your body.  You have weakness  in only one area of the body or on only one side of the body.  You have a fever.  You have trouble speaking or swallowing.  You cannot control your bladder or bowel movements.  You have black or bloody vomit or stools. MAKE SURE YOU:  Understand these instructions.  Will watch your condition.  Will get help right away if you are not doing well or get worse. Document Released: 06/07/2005 Document Revised: 12/07/2011 Document Reviewed: 08/06/2011 [facility] [facility] [employer] Patient Information 2015 [place], [place]. This information is not intended to replace advice given to you by your health care provider. Make sure you discuss any questions you have with your health care provider. Transient Ischemic Attack A transient ischemic attack (TIA) is a "warning stroke" that causes stroke-like symptoms. Unlike a stroke, a TIA does not cause permanent damage to the brain. The symptoms of a TIA can happen very fast and do not last long. It is important to know the symptoms of a TIA and what to do. This can help prevent a major stroke or death. CAUSES   A TIA is caused by a temporary blockage in an artery in the brain or neck (carotid artery). The blockage does not allow the brain to get the blood supply it needs and can cause different symptoms. The blockage can be caused by either:  A blood clot.  Fatty buildup (plaque) in a neck or brain artery. RISK FACTORS  High blood pressure (hypertension).  High cholesterol.  Diabetes mellitus.  Heart disease.  The build up of plaque in the blood vessels (peripheral artery disease or atherosclerosis).  The build  up of plaque in the blood vessels providing blood and oxygen to the brain (carotid artery stenosis).  An abnormal heart rhythm (atrial fibrillation).  Obesity.  Smoking.  Taking oral contraceptives (especially in combination with smoking).  Physical inactivity.  A diet high in fats, salt (sodium), and calories.  Alcohol use.  Use of illegal  drugs (especially cocaine and methamphetamine).  Being male.  Being African American.  Being over the age of 44.  Family history of stroke.  Previous history of blood clots, stroke, TIA, or heart attack.  Sickle cell disease. SYMPTOMS  TIA symptoms are the same as a stroke but are temporary. These symptoms usually develop suddenly, or may be newly present upon awakening from sleep:  Sudden weakness or numbness of the face, arm, or leg, especially on one side of the body.  Sudden trouble walking or difficulty moving arms or legs.  Sudden confusion.  Sudden personality changes.  Trouble speaking (aphasia) or understanding.  Difficulty swallowing.  Sudden trouble seeing in one or both eyes.  Double vision.  Dizziness.  Loss of balance or coordination.  Sudden severe headache with no known cause.  Trouble reading or writing.  Loss of bowel or bladder control.  Loss of consciousness. DIAGNOSIS  Your caregiver may be able to determine the presence or absence of a TIA based on your symptoms, history, and physical exam. Computed tomography (CT scan) of the brain is usually performed to help identify a TIA. Other tests may be done to diagnose a TIA. These tests may include:  Electrocardiography.  Continuous heart monitoring.  Echocardiography.  Carotid ultrasonography.  Magnetic resonance imaging (MRI).  A scan of the brain circulation.  Blood tests. PREVENTION  The risk of a TIA can be decreased by appropriately treating high blood pressure, high cholesterol, diabetes, heart disease, and obesity and by quitting smoking, limiting alcohol, and staying physically active. TREATMENT  Time is of the essence. Since the symptoms of TIA are the same as a stroke, it is important to seek treatment as soon as possible because you may need a medicine to dissolve the clot (thrombolytic) that cannot be given if too much time has passed. Treatment options vary. Treatment options  may include rest, oxygen, intravenous (IV) fluids, and medicines to thin the blood (anticoagulants). Medicines and diet may be used to address diabetes, high blood pressure, and other risk factors. Measures will be taken to prevent short-term and long-term complications, including infection from breathing foreign material into the lungs (aspiration pneumonia), blood clots in the legs, and falls. Treatment options include procedures to either remove plaque in the carotid arteries or dilate carotid arteries that have narrowed due to plaque. Those procedures are:  Carotid endarterectomy.  Carotid angioplasty and stenting. HOME CARE INSTRUCTIONS   Take all medicines prescribed by your caregiver. Follow the directions carefully. Medicines may be used to control risk factors for a stroke. Be sure you understand all your medicine instructions.  You may be told to take aspirin or the anticoagulant warfarin. Warfarin needs to be taken exactly as instructed.  Taking too much or too little warfarin is dangerous. Too much warfarin increases the risk of bleeding. Too little warfarin continues to allow the risk for blood clots. While taking warfarin, you will need to have regular blood tests to measure your blood clotting time. A PT blood test measures how long it takes for blood to clot. Your PT is used to calculate another value called an INR. Your PT and INR help your  caregiver to adjust your dose of warfarin. The dose can change for many reasons. It is critically important that you take warfarin exactly as prescribed.  Many foods, especially foods high in vitamin K can interfere with warfarin and affect the PT and INR. Foods high in vitamin K include spinach, kale, broccoli, cabbage, collard and turnip greens, brussels sprouts, peas, cauliflower, seaweed, and parsley as well as beef and pork liver, green tea, and soybean oil. You should eat a consistent amount of foods high in vitamin K. Avoid major changes in  your diet, or notify your caregiver before changing your diet. Arrange a visit with a dietitian to answer your questions.  Many medicines can interfere with warfarin and affect the PT and INR. You must tell your caregiver about any and all medicines you take, this includes all vitamins and supplements. Be especially cautious with aspirin and anti-inflammatory medicines. Do not take or discontinue any prescribed or over-the-counter medicine except on the advice of your caregiver or pharmacist.  Warfarin can have side effects, such as excessive bruising or bleeding. You will need to hold pressure over cuts for longer than usual. Your caregiver or pharmacist will discuss other potential side effects.  Avoid sports or activities that may cause injury or bleeding.  Be mindful when shaving, flossing your teeth, or handling sharp objects.  Alcohol can change the body's ability to handle warfarin. It is best to avoid alcoholic drinks or consume only very small amounts while taking warfarin. Notify your caregiver if you change your alcohol intake.  Notify your dentist or other caregivers before procedures.  Eat a diet that includes 5 or more servings of fruits and vegetables each day. This may reduce the risk of stroke. Certain diets may be prescribed to address high blood pressure, high cholesterol, diabetes, or obesity.  A low-sodium, low-saturated fat, low-trans fat, low-cholesterol diet is recommended to manage high blood pressure.  A low-saturated fat, low-trans fat, low-cholesterol, and high-fiber diet may control cholesterol levels.  A controlled-carbohydrate, controlled-sugar diet is recommended to manage diabetes.  A reduced-calorie, low-sodium, low-saturated fat, low-trans fat, low-cholesterol diet is recommended to manage obesity.  Maintain a healthy weight.  Stay physically active. It is recommended that you get at least 30 minutes of activity on most or all days.  Do not  smoke.  Limit alcohol use even if you are not taking warfarin. Moderate alcohol use is considered to be:  No more than 2 drinks each day for men.  No more than 1 drink each day for nonpregnant women.  Stop drug abuse.  Home safety. A safe home environment is important to reduce the risk of falls. Your caregiver may arrange for specialists to evaluate your home. Having grab bars in the bedroom and bathroom is often important. Your caregiver may arrange for equipment to be used at home, such as raised toilets and a seat for the shower.  Follow all instructions for follow-up with your caregiver. This is very important. This includes any referrals and lab tests. Proper follow up can prevent a stroke or another TIA from occurring. SEEK MEDICAL CARE IF:  You have personality changes.  You have difficulty swallowing.  You are seeing double.  You have dizziness.  You have a fever.  You have skin breakdown. SEEK IMMEDIATE MEDICAL CARE IF:  Any of these symptoms may represent a serious problem that is an emergency. Do not wait to see if the symptoms will go away. Get medical help right away. Call your local  emergency services (911 in [place] Do not drive yourself to the hospital.  You have sudden weakness or numbness of the face, arm, or leg, especially on one side of the body.  You have sudden trouble walking or difficulty moving arms or legs.  You have sudden confusion.  You have trouble speaking (aphasia) or understanding.  You have sudden trouble seeing in one or both eyes.  You have a loss of balance or coordination.  You have a sudden, severe headache with no known cause.  You have new chest pain or an irregular heartbeat.  You have a partial or total loss of consciousness. MAKE SURE YOU:   Understand these instructions.  Will watch your condition.  Will get help right away if you are not doing well or get worse. Document Released: 03/17/2005 Document Revised:  06/12/2013 Document Reviewed: 09/12/2013 [facility] Patient Information 2015 [place], [place]. This information is not intended to replace advice given to you by your health care provider. Make sure you discuss any questions you have with your health care provider.

## 2014-05-03 NOTE — Discharge Summary (Signed)
Physician Discharge Summary  Alex Turner ZOX:[id number] DOB: [birth year] DOA: 05/01/2014  PCP: Alex Lodge, FNP  Admit date: 05/01/2014 Discharge date: 05/03/2014  Recommendations for Outpatient Follow-up:  1. Follow up with PCP in 1-2 weeks after discharge to make sure symptoms are controlled    Discharge Diagnoses:  Active Problems:   Syncope   Diabetes mellitus   TIA (transient ischemic attack)    Discharge Condition: stable   Diet recommendation: as tolerated   History of present illness:  43 y.o. male with past medical history of borderline personality disorder, bipolar, anxiety, depression, diabetes who presented to [facility] 05/01/2014 with complaints of sudden onset confusion, slurred speach and lower extremity weakness left more than right. He reported he nearly passed out. Pt does not recall having similar episodes in past. By now, his symptoms have mostly resolved except for LE weakness. CT head and MRI brain essentially unremarkable.  Assessment/Plan:    Active Problems: TIA (transient ischemic attack) - CT head and MRI brain all essentially unremarkable - 2 D ECHO, EEG, carotid doppler - WNL - per PT eval - HH orders all in place  - continue aspirin daily - fasting lipid panel revealed TGC 137, HDL 25 and LDL 110. He is not diabetic so LDL needs to be monitor on outpt basis  - no further recommendations from neurology  Probable new diabetes - A1c 5.7; prediabetic and i spoke with the pt about the appropriate diet  Neuropathy  - continue gabapentin 400 mg PO TID Bipolar disorder, depression - resumed home meds   DVT Prophylaxis  - SCD's and  Aspirin   Code Status: Full.  Family Communication: plan of care discussed with the patient  IV access:   PeripheralIV  Procedures and diagnostic studies:   Dg Chest 2 View 05/01/2014 No active cardiopulmonary disease.   Ct Head Wo Contrast 05/01/2014 No acute intracranial abnormality noted.  Electronically Signed By: Alex Turner M.D. On: 05/01/2014 17:06   Mr Alex Turner Head Wo Contrast 05/01/2014 Normal MRA of the intracranial circulation   Mr Brain Wo Contrast 05/01/2014 Normal MRA of the intracranial circulation   Mr Cervical Spine W Wo Contrast 05/01/2014 1. No MRI evidence for acute traumatic injury within the cervical spine. 2. Degenerative disc disease at C5-6 with resultant moderate right and mild left foraminal narrowing, with mild central canal stenosis. 3. Mild degenerative disc disease at C6-7 with resultant moderate left with mild right foraminal narrowing.   Medical Consultants:   Neurology  Other Consultants:   Physical therapy  IAnti-Infectives:    None   Signed:  Manson Passey, MD  [facility] 05/03/2014, 11:46 AM  Pager #: [phone]   Discharge Exam: Filed Vitals:   05/03/14 1043  BP: 128/77  Pulse: 64  Temp: 97.3 F (36.3 C)  Resp: 18   Filed Vitals:   05/02/14 2013 05/03/14 0025 05/03/14 0419 05/03/14 1043  BP: 100/67 103/51 120/68 128/77  Pulse: 67 60 65 64  Temp: 97.9 F (36.6 C) 97.7 F (36.5 C) 97.7 F (36.5 C) 97.3 F (36.3 C)  TempSrc: Oral Oral Oral Oral  Resp: 18 18 18 18   Height:      Weight:      SpO2: 97% 96% 98% 97%    General: Pt is alert, follows commands appropriately, not in acute distress Cardiovascular: Regular rate and rhythm, S1/S2 +, no murmurs Respiratory: Clear to auscultation bilaterally, no wheezing, no crackles, no rhonchi Abdominal: Soft, non tender, non distended, bowel sounds +, no guarding  Extremities: no edema, no cyanosis, pulses palpable bilaterally DP and PT Neuro: Grossly nonfocal  Discharge Instructions  Discharge Instructions    Call MD for:  difficulty breathing, headache or visual disturbances    Complete by:  As directed      Call MD for:  persistant dizziness or light-headedness    Complete by:  As directed      Call MD for:  persistant nausea and  vomiting    Complete by:  As directed      Call MD for:  severe uncontrolled pain    Complete by:  As directed      Diet - low sodium heart healthy    Complete by:  As directed      Increase activity slowly    Complete by:  As directed             Medication List    STOP taking these medications        lactated ringers infusion      TAKE these medications        acetaminophen 500 MG tablet  Commonly known as:  TYLENOL  Take 1,000 mg by mouth every 6 (six) hours as needed for headache (headache).     aspirin EC 81 MG tablet  Take 1 tablet (81 mg total) by mouth daily.     baclofen 10 MG tablet  Commonly known as:  LIORESAL  Take 10 mg by mouth 3 (three) times daily.     busPIRone 5 MG tablet  Commonly known as:  BUSPAR  Take 5 mg by mouth every morning.     cholecalciferol 1000 UNITS tablet  Commonly known as:  VITAMIN D  Take 1,000 Units by mouth 2 (two) times daily.     gabapentin 300 MG capsule  Commonly known as:  NEURONTIN  Take 400 mg by mouth 3 (three) times daily.     loratadine 10 MG tablet  Commonly known as:  CLARITIN  Take 10 mg by mouth at bedtime.     meloxicam 15 MG tablet  Commonly known as:  MOBIC  Take 15 mg by mouth every morning.     multivitamin with minerals Tabs tablet  Take 1 tablet by mouth every morning.     omeprazole 20 MG capsule  Commonly known as:  PRILOSEC  Take 20 mg by mouth 2 (two) times daily before a meal.     ondansetron 4 MG tablet  Commonly known as:  ZOFRAN  Take 4 mg by mouth every 6 (six) hours as needed for nausea or vomiting (nausea).     Oxcarbazepine 300 MG tablet  Commonly known as:  TRILEPTAL  Take 300 mg by mouth 2 (two) times daily.     terazosin 5 MG capsule  Commonly known as:  HYTRIN  Take 5 mg by mouth at bedtime.     traMADol 50 MG tablet  Commonly known as:  ULTRAM  Take 50 mg by mouth every 6 (six) hours as needed for moderate pain.     traZODone 50 MG tablet  Commonly known as:   DESYREL  Take 50 mg by mouth at bedtime.           Follow-up Information    Follow up with AlexANTHONY, FNP. Schedule an appointment as soon as possible for a visit in 1 week.   Specialty:  Nurse Practitioner   Why:  Follow up appt after recent hospitalization   Contact information:   [address] [place] [address] [phone]        The results of significant diagnostics from this hospitalization (including imaging, microbiology, ancillary and laboratory) are listed below for reference.    Significant Diagnostic Studies: Dg Chest 2 View  05/01/2014   CLINICAL DATA:  Headache.  Dizziness.  Fall.  Tobacco use.  Syncope.  EXAM: CHEST  2 VIEW  COMPARISON:  10/11/2006  FINDINGS: The heart size and mediastinal contours are within normal limits. Both lungs are clear. The visualized skeletal structures are unremarkable.  IMPRESSION: No active cardiopulmonary disease.   Electronically Signed   By: Herbie Baltimore M.D.   On: 05/01/2014 16:01   Ct Head Wo Contrast  05/01/2014   CLINICAL DATA:  Syncopal episode with occipital headaches and left arm numbness  EXAM: CT HEAD WITHOUT CONTRAST  TECHNIQUE: Contiguous axial images were obtained from the base of the skull through the vertex without intravenous contrast.  COMPARISON:  09/08/2007  FINDINGS: Bony calvarium is intact. No gross soft tissue abnormality is noted. The ventricles are within normal limits. No findings to suggest acute hemorrhage, acute infarction or space-occupying mass lesion are noted.  IMPRESSION: No acute intracranial abnormality noted.   Electronically Signed   By: Alex Turner M.D.   On: 05/01/2014 17:06   Mr Alex Turner Head Wo Contrast  05/01/2014   CLINICAL DATA:  Initial evaluation for syncope.  Slurred speech.  EXAM: MRI HEAD WITHOUT CONTRAST  MRA HEAD WITHOUT CONTRAST  TECHNIQUE: Multiplanar, multiecho pulse sequences of the brain and surrounding structures were obtained without intravenous contrast.  Angiographic images of the head were obtained using MRA technique without contrast.  COMPARISON:  Prior CT performed earlier on the same day.  FINDINGS: MRI HEAD FINDINGS  The CSF containing spaces are within normal limits for patient age. No focal parenchymal signal abnormality is identified. No mass lesion, midline shift, or extra-axial fluid collection. Ventricles are normal in size without evidence of hydrocephalus.  No diffusion-weighted signal abnormality is identified to suggest acute intracranial infarct. Gray-white matter differentiation is maintained. Normal flow voids are seen within the intracranial vasculature. No intracranial hemorrhage identified.  The cervicomedullary junction is normal. Pituitary gland is within normal limits. Pituitary stalk is midline. The globes and optic nerves demonstrate a normal appearance with normal signal intensity. The  The bone marrow signal intensity is normal. Calvarium is intact. Visualized upper cervical spine is within normal limits.  Scalp soft tissues are unremarkable.  Retention cyst present within the left maxillary sinus. Paranasal sinus otherwise clear. No mastoid effusion.  MRA HEAD FINDINGS  ANTERIOR CIRCULATION:  The visualized portions of the distal cervical internal carotid arteries are widely patent with antegrade flow. The petrous, cavernous, and supra clinoid segments are symmetric in caliber bilaterally. A1 segments, anterior communicating artery, and anterior cerebral arteries are widely patent. Middle cerebral arteries are widely patent with antegrade flow without high-grade flow-limiting stenosis or proximal branch occlusion. Distal MCA branches are symmetric bilaterally. No intracranial aneurysm within the anterior circulation.  POSTERIOR CIRCULATION:  The vertebral arteries are widely patent with antegrade flow. The posterior inferior cerebral arteries are normal. Vertebrobasilar junction and basilar artery are widely patent with antegrade flow  without evidence of basilar tip stenosis or aneurysm. Posterior cerebral arteries are normal bilaterally. The superior cerebellar arteries are widely patent bilaterally. No intracranial aneurysm within the posterior circulation.  IMPRESSION: MRI HEAD IMPRESSION:  1. Normal MRI of the brain with no acute intracranial infarct or other abnormality identified. 2. Left maxillary sinus retention  cyst.  MRA HEAD IMPRESSION:  Normal MRA of the intracranial circulation   Electronically Signed   By: Rise MuBenjamin  McClintock M.D.   On: 05/01/2014 22:08   Mr Brain Wo Contrast  05/01/2014   CLINICAL DATA:  Initial evaluation for syncope.  Slurred speech.  EXAM: MRI HEAD WITHOUT CONTRAST  MRA HEAD WITHOUT CONTRAST  TECHNIQUE: Multiplanar, multiecho pulse sequences of the brain and surrounding structures were obtained without intravenous contrast. Angiographic images of the head were obtained using MRA technique without contrast.  COMPARISON:  Prior CT performed earlier on the same day.  FINDINGS: MRI HEAD FINDINGS  The CSF containing spaces are within normal limits for patient age. No focal parenchymal signal abnormality is identified. No mass lesion, midline shift, or extra-axial fluid collection. Ventricles are normal in size without evidence of hydrocephalus.  No diffusion-weighted signal abnormality is identified to suggest acute intracranial infarct. Gray-white matter differentiation is maintained. Normal flow voids are seen within the intracranial vasculature. No intracranial hemorrhage identified.  The cervicomedullary junction is normal. Pituitary gland is within normal limits. Pituitary stalk is midline. The globes and optic nerves demonstrate a normal appearance with normal signal intensity. The  The bone marrow signal intensity is normal. Calvarium is intact. Visualized upper cervical spine is within normal limits.  Scalp soft tissues are unremarkable.  Retention cyst present within the left maxillary sinus. Paranasal  sinus otherwise clear. No mastoid effusion.  MRA HEAD FINDINGS  ANTERIOR CIRCULATION:  The visualized portions of the distal cervical internal carotid arteries are widely patent with antegrade flow. The petrous, cavernous, and supra clinoid segments are symmetric in caliber bilaterally. A1 segments, anterior communicating artery, and anterior cerebral arteries are widely patent. Middle cerebral arteries are widely patent with antegrade flow without high-grade flow-limiting stenosis or proximal branch occlusion. Distal MCA branches are symmetric bilaterally. No intracranial aneurysm within the anterior circulation.  POSTERIOR CIRCULATION:  The vertebral arteries are widely patent with antegrade flow. The posterior inferior cerebral arteries are normal. Vertebrobasilar junction and basilar artery are widely patent with antegrade flow without evidence of basilar tip stenosis or aneurysm. Posterior cerebral arteries are normal bilaterally. The superior cerebellar arteries are widely patent bilaterally. No intracranial aneurysm within the posterior circulation.  IMPRESSION: MRI HEAD IMPRESSION:  1. Normal MRI of the brain with no acute intracranial infarct or other abnormality identified. 2. Left maxillary sinus retention cyst.  MRA HEAD IMPRESSION:  Normal MRA of the intracranial circulation   Electronically Signed   By: Rise MuBenjamin  McClintock M.D.   On: 05/01/2014 22:08   Mr Cervical Spine W Wo Contrast  05/01/2014   CLINICAL DATA:  Initial evaluation for acute trauma. Fall. Neck pain. History of degenerative disc disease.  EXAM: MRI CERVICAL SPINE WITHOUT AND WITH CONTRAST  TECHNIQUE: Multiplanar and multiecho pulse sequences of the cervical spine, to include the craniocervical junction and cervicothoracic junction, were obtained according to standard protocol without and with intravenous contrast.  CONTRAST:  20mL MULTIHANCE GADOBENATE DIMEGLUMINE 529 MG/ML IV SOLN  COMPARISON:  None available.  FINDINGS:  Visualized portions of the brain and posterior fossa demonstrate a normal appearance with normal signal intensity. Craniocervical junction is widely patent.  Vertebral bodies are normally aligned with preservation of the normal cervical lordosis. Vertebral body heights are preserved. No acute fracture or listhesis.  Signal intensity within the vertebral body bone marrow is normal. No focal osseous lesion.  Signal intensity within the cervical spinal cord is normal. No evidence for ligamentous injury. No epidural hematoma or other  collection.  Paraspinous soft tissues within normal limits.  No abnormal enhancement within the cervical spine.  C2-3:  Negative.  C3-4: Mild bilateral uncovertebral spurring. No significant disc bulge. No canal or foraminal stenosis.  C4-5: Minimal bilateral uncovertebral spurring. Minimal posterior disc bulging noted. No significant canal or foraminal stenosis.  C5-6: Mild diffuse degenerative disc osteophyte with bilateral uncovertebral spurring, right greater than left. There is resultant moderate right foraminal stenosis with mild left foraminal narrowing. Small posterior disc bulge mildly flattens the ventral thecal sac and results in mild canal stenosis.  C6-7: Mild diffuse degenerative disc osteophyte with bilateral uncovertebral spurring, left greater than right. There is resultant moderate left with mild right foraminal narrowing. No significant central canal stenosis.  C7-T1:  Negative.  IMPRESSION: 1. No MRI evidence for acute traumatic injury within the cervical spine. 2. Degenerative disc disease at C5-6 with resultant moderate right and mild left foraminal narrowing, with mild central canal stenosis. 3. Mild degenerative disc disease at C6-7 with resultant moderate left with mild right foraminal narrowing.   Electronically Signed   By: Rise MuBenjamin  McClintock M.D.   On: 05/01/2014 22:46    Microbiology: Recent Results (from the past 240 hour(s))  Culture, Urine     Status:  None   Collection Time: 05/01/14  9:19 PM  Result Value Ref Range Status   Specimen Description URINE, CLEAN CATCH  Final   Special Requests NONE  Final   Culture  Setup Time   Final    05/02/2014 01:24 Performed at [facility]    Colony Count NO GROWTH Performed at [facility]   Final   Culture NO GROWTH Performed at [facility]   Final   Report Status 05/02/2014 FINAL  Final     Labs: Basic Metabolic Panel:  Recent Labs Lab 05/01/14 1604  NA 138  K 3.9  CL 102  CO2 23  GLUCOSE 93  BUN 7  CREATININE 0.84  CALCIUM 9.0   Liver Function Tests:  Recent Labs Lab 05/01/14 1604  AST 21  ALT 15  ALKPHOS 46  BILITOT 0.3  PROT 6.1  ALBUMIN 3.4*   No results for input(s): LIPASE, AMYLASE in the last 168 hours. No results for input(s): AMMONIA in the last 168 hours. CBC:  Recent Labs Lab 05/01/14 1604  WBC 10.2  NEUTROABS 6.5  HGB 15.6  HCT 46.6  MCV 82.2  PLT 245   Cardiac Enzymes:  Recent Labs Lab 05/01/14 2036 05/02/14 0400 05/02/14 0945 05/02/14 1605  TROPONINI <0.30 <0.30 <0.30 <0.30   BNP: BNP (last 3 results) No results for input(s): PROBNP in the last 8760 hours. CBG:  Recent Labs Lab 05/02/14 0726 05/02/14 1141 05/02/14 1626 05/02/14 2042 05/03/14 0717  GLUCAP 175* 109* 125* 127* 110*    Time coordinating discharge: Over 30 minutes

## 2014-05-03 NOTE — Progress Notes (Signed)
CARE MANAGEMENT NOTE 05/03/2014  Patient:  Alex Turner,Alex M   Account Number:  [id number]  Date Initiated:  05/03/2014  Documentation initiated by:  Trinna BalloonMcGIBBONEY,COOKIE Shanzay Hepworth  Subjective/Objective Assessment:   Pt admitted with cco syncope     Action/Plan:   from home   Anticipated DC Date:  05/06/2014   Anticipated DC Plan:  HOME/SELF CARE      DC Planning Services  CM consult      Choice offered to / List presented to:             Status of service:  In process, will continue to follow Medicare Important Message given?   (If response is "NO", the following Medicare IM given date fields will be blank) Date Medicare IM given:   Medicare IM given by:   Date Additional Medicare IM given:   Additional Medicare IM given by:    Discharge Disposition:  HOME/SELF CARE  Per UR Regulation:  Reviewed for med. necessity/level of care/duration of stay  If discussed at Long Length of Stay Meetings, dates discussed:    Comments:  05/03/14 MMcGibboney, RN, BSN Spoke with pt concerning discharge plan and  [facility].  Referral given to in house rep.

## 2014-05-03 NOTE — Plan of Care (Signed)
Problem: Consults Goal: Ischemic Stroke Patient Education See Patient Education Module for education specifics.  Outcome: Completed/Met Date Met:  05/03/14     

## 2014-05-03 NOTE — Progress Notes (Signed)
CSW & RNCM, Cookie spoke with patient re: discharge planning. CSW reviewed PT/OT evaluations recommending SNF, spoke with Ivin Booty Public house manager) who states that she met with patient 11/12 & determined that patient would not qualify for assistance. Patient made aware & was offered SNF if patient was able to pay privately - patient states that he is not able. Patient to discharge home, RNCM to arrange for home health services. Patient's friend to pickup patient. Patient provided with information re: applying for Disability & Medicaid.   No further CSW needs identified - CSW signing off.   Raynaldo Opitz, [facility] Clinical Social Worker cell #: [phone]

## 2014-05-05 LAB — VITAMIN B1: VITAMIN B1 (THIAMINE): 14 nmol/L (ref 8–30)

## 2014-05-08 ENCOUNTER — Emergency Department (HOSPITAL_COMMUNITY)
Admission: EM | Admit: 2014-05-08 | Discharge: 2014-05-08 | Disposition: A | Discharge status: Home / Self Care | Payer: No Typology Code available for payment source | Attending: Emergency Medicine | Admitting: Emergency Medicine

## 2014-05-08 ENCOUNTER — Emergency Department (HOSPITAL_COMMUNITY): Payer: Self-pay

## 2014-05-08 ENCOUNTER — Emergency Department (HOSPITAL_COMMUNITY): Payer: No Typology Code available for payment source

## 2014-05-08 ENCOUNTER — Encounter (HOSPITAL_COMMUNITY): Payer: Self-pay | Admitting: Vascular Surgery

## 2014-05-08 DIAGNOSIS — F418 Other specified anxiety disorders: Secondary | ICD-10-CM | POA: Insufficient documentation

## 2014-05-08 DIAGNOSIS — R200 Anesthesia of skin: Secondary | ICD-10-CM | POA: Insufficient documentation

## 2014-05-08 DIAGNOSIS — M1990 Unspecified osteoarthritis, unspecified site: Secondary | ICD-10-CM | POA: Insufficient documentation

## 2014-05-08 DIAGNOSIS — Z880 Allergy status to penicillin: Secondary | ICD-10-CM | POA: Insufficient documentation

## 2014-05-08 DIAGNOSIS — Z79899 Other long term (current) drug therapy: Secondary | ICD-10-CM | POA: Insufficient documentation

## 2014-05-08 DIAGNOSIS — R531 Weakness: Secondary | ICD-10-CM | POA: Insufficient documentation

## 2014-05-08 DIAGNOSIS — M6289 Other specified disorders of muscle: Secondary | ICD-10-CM

## 2014-05-08 DIAGNOSIS — Z720 Tobacco use: Secondary | ICD-10-CM | POA: Insufficient documentation

## 2014-05-08 DIAGNOSIS — F319 Bipolar disorder, unspecified: Secondary | ICD-10-CM | POA: Insufficient documentation

## 2014-05-08 DIAGNOSIS — K219 Gastro-esophageal reflux disease without esophagitis: Secondary | ICD-10-CM | POA: Insufficient documentation

## 2014-05-08 HISTORY — DX: Transient cerebral ischemic attack, unspecified: G45.9

## 2014-05-08 LAB — I-STAT CHEM 8, ED
BUN: 8 mg/dL (ref 6–23)
CREATININE: 0.8 mg/dL (ref 0.50–1.35)
Calcium, Ion: 1.13 mmol/L (ref 1.12–1.23)
Chloride: 105 mEq/L (ref 96–112)
GLUCOSE: 102 mg/dL — AB (ref 70–99)
HCT: 44 % (ref 39.0–52.0)
Hemoglobin: 15 g/dL (ref 13.0–17.0)
POTASSIUM: 3.8 meq/L (ref 3.7–5.3)
Sodium: 138 mEq/L (ref 137–147)
TCO2: 23 mmol/L (ref 0–100)

## 2014-05-08 LAB — CBC
HCT: 42.8 % (ref 39.0–52.0)
Hemoglobin: 14.3 g/dL (ref 13.0–17.0)
MCH: 27.3 pg (ref 26.0–34.0)
MCHC: 33.4 g/dL (ref 30.0–36.0)
MCV: 81.7 fL (ref 78.0–100.0)
PLATELETS: 241 10*3/uL (ref 150–400)
RBC: 5.24 MIL/uL (ref 4.22–5.81)
RDW: 13 % (ref 11.5–15.5)
WBC: 7.3 10*3/uL (ref 4.0–10.5)

## 2014-05-08 LAB — URINALYSIS, ROUTINE W REFLEX MICROSCOPIC
Bilirubin Urine: NEGATIVE
Glucose, UA: NEGATIVE mg/dL
Hgb urine dipstick: NEGATIVE
KETONES UR: NEGATIVE mg/dL
Nitrite: NEGATIVE
PH: 7.5 (ref 5.0–8.0)
Protein, ur: NEGATIVE mg/dL
SPECIFIC GRAVITY, URINE: 1.009 (ref 1.005–1.030)
Urobilinogen, UA: 0.2 mg/dL (ref 0.0–1.0)

## 2014-05-08 LAB — COMPREHENSIVE METABOLIC PANEL
ALK PHOS: 51 U/L (ref 39–117)
ALT: 17 U/L (ref 0–53)
AST: 19 U/L (ref 0–37)
Albumin: 3.8 g/dL (ref 3.5–5.2)
Anion gap: 13 (ref 5–15)
BUN: 10 mg/dL (ref 6–23)
CHLORIDE: 103 meq/L (ref 96–112)
CO2: 25 mEq/L (ref 19–32)
Calcium: 9.5 mg/dL (ref 8.4–10.5)
Creatinine, Ser: 0.75 mg/dL (ref 0.50–1.35)
GFR calc non Af Amer: >90 mL/min (ref >90)
GLUCOSE: 100 mg/dL — AB (ref 70–99)
Potassium: 4 mEq/L (ref 3.7–5.3)
Sodium: 141 mEq/L (ref 137–147)
Total Bilirubin: 0.2 mg/dL — ABNORMAL LOW (ref 0.3–1.2)
Total Protein: 6.6 g/dL (ref 6.0–8.3)

## 2014-05-08 LAB — PROTIME-INR
INR: 0.95 (ref 0.00–1.49)
Prothrombin Time: 12.8 seconds (ref 11.6–15.2)

## 2014-05-08 LAB — RAPID URINE DRUG SCREEN, HOSP PERFORMED
Amphetamines: NOT DETECTED
BARBITURATES: NOT DETECTED
Benzodiazepines: NOT DETECTED
Cocaine: NOT DETECTED
Opiates: NOT DETECTED
TETRAHYDROCANNABINOL: POSITIVE — AB

## 2014-05-08 LAB — URINE MICROSCOPIC-ADD ON

## 2014-05-08 LAB — DIFFERENTIAL
Basophils Absolute: 0 10*3/uL (ref 0.0–0.1)
Basophils Relative: 0 % (ref 0–1)
Eosinophils Absolute: 0.1 10*3/uL (ref 0.0–0.7)
Eosinophils Relative: 1 % (ref 0–5)
LYMPHS ABS: 2.3 10*3/uL (ref 0.7–4.0)
LYMPHS PCT: 31 % (ref 12–46)
Monocytes Absolute: 0.4 10*3/uL (ref 0.1–1.0)
Monocytes Relative: 5 % (ref 3–12)
NEUTROS ABS: 4.6 10*3/uL (ref 1.7–7.7)
Neutrophils Relative %: 63 % (ref 43–77)

## 2014-05-08 LAB — APTT: aPTT: 29 seconds (ref 24–37)

## 2014-05-08 LAB — ETHANOL: Alcohol, Ethyl (B): <11 mg/dL (ref 0–11)

## 2014-05-08 LAB — I-STAT TROPONIN, ED: Troponin i, poc: 0 ng/mL (ref 0.00–0.08)

## 2014-05-08 NOTE — ED Notes (Signed)
Pt reports to the ED for eval of left sided facial numbness and increased left sided weakness that began at 9 am. He symptoms of lightheadedness as well. Pt was [facility]. He reports he had some left sided deficits and dysarthria. Speech clear at this time. No facial droop noted. Left sided drift and weakness noted. Dr. Manus Gunningancour at bedside. Code stroke called. Pt NSR on monitor. Report intermittent HAs. Denies it at this time. Pt A&Ox4, resp e/u, and skin warm and dry.

## 2014-05-08 NOTE — Discharge Instructions (Signed)
Read the information below.  You may return to the Emergency Department at any time for worsening condition or any new symptoms that concern you.  Please follow up with your primary care provider this week.  Continue physical therapy at home as previously planned.   Your caregiver has seen you today because you are having problems with feelings of weakness, dizziness, and/or fatigue. Weakness has many different causes, some of which are common and others are very rare. Your caregiver has considered some of the most common causes of weakness and feels it is safe for you to go home and be observed. Not every illness or injury can be identified during an emergency department visit, thus follow-up with your primary healthcare provider is important. Medical conditions can also worsen, so it is also important to return immediately as directed below, or if you have other serious concerns develop. RETURN IMMEDIATELY IF you develop new shortness of breath, chest pain, fever, have difficulty moving parts of your body (new weakness, numbness, or incoordination), sudden change in speech, vision, swallowing, or understanding, faint or develop new dizziness, severe headache, become poorly responsive or have an altered mental status compared to baseline for you, new rash, abdominal pain, or bloody stools,  Return sooner also if you develop new problems for which you have not talked to your caregiver but you feel may be emergency medical conditions, or are unable to be cared for safely at home.   Weakness Weakness is a lack of strength. It may be felt all over the body (generalized) or in one specific part of the body (focal). Some causes of weakness can be serious. You may need further medical evaluation, especially if you are elderly or you have a history of immunosuppression (such as chemotherapy or HIV), kidney disease, heart disease, or diabetes. CAUSES  Weakness can be caused by many different things,  including:  Infection.  Physical exhaustion.  Internal bleeding or other blood loss that results in a lack of red blood cells (anemia).  Dehydration. This cause is more common in elderly people.  Side effects or electrolyte abnormalities from medicines, such as pain medicines or sedatives.  Emotional distress, anxiety, or depression.  Circulation problems, especially severe peripheral arterial disease.  Heart disease, such as rapid atrial fibrillation, bradycardia, or heart failure.  Nervous system disorders, such as Guillain-Barr syndrome, multiple sclerosis, or stroke. DIAGNOSIS  To find the cause of your weakness, your caregiver will take your history and perform a physical exam. Lab tests or X-rays may also be ordered, if needed. TREATMENT  Treatment of weakness depends on the cause of your symptoms and can vary greatly. HOME CARE INSTRUCTIONS   Rest as needed.  Eat a well-balanced diet.  Try to get some exercise every day.  Only take over-the-counter or prescription medicines as directed by your caregiver. SEEK MEDICAL CARE IF:   Your weakness seems to be getting worse or spreads to other parts of your body.  You develop new aches or pains. SEEK IMMEDIATE MEDICAL CARE IF:   You cannot perform your normal daily activities, such as getting dressed and feeding yourself.  You cannot walk up and down stairs, or you feel exhausted when you do so.  You have shortness of breath or chest pain.  You have difficulty moving parts of your body.  You have weakness in only one area of the body or on only one side of the body.  You have a fever.  You have trouble speaking or swallowing.  You cannot control your bladder or bowel movements.  You have black or bloody vomit or stools. MAKE SURE YOU:  Understand these instructions.  Will watch your condition.  Will get help right away if you are not doing well or get worse. Document Released: 06/07/2005 Document  Revised: 12/07/2011 Document Reviewed: 08/06/2011 [facility] Patient Information 2015 [place], [place]. This information is not intended to replace advice given to you by your health care provider. Make sure you discuss any questions you have with your health care provider.  Paresthesia Paresthesia is an abnormal burning or prickling sensation. This sensation is generally felt in the hands, arms, legs, or feet. However, it may occur in any part of the body. It is usually not painful. The feeling may be described as:  Tingling or numbness.  "Pins and needles."  Skin crawling.  Buzzing.  Limbs "falling asleep."  Itching. Most people experience temporary (transient) paresthesia at some time in their lives. CAUSES  Paresthesia may occur when you breathe too quickly (hyperventilation). It can also occur without any apparent cause. Commonly, paresthesia occurs when pressure is placed on a nerve. The feeling quickly goes away once the pressure is removed. For some people, however, paresthesia is a long-lasting (chronic) condition caused by an underlying disorder. The underlying disorder may be:  A traumatic, direct injury to nerves. Examples include a:  Broken (fractured) neck.  Fractured skull.  A disorder affecting the brain and spinal cord (central nervous system). Examples include:  Transverse myelitis.  Encephalitis.  Transient ischemic attack.  Multiple sclerosis.  Stroke.  Tumor or blood vessel problems, such as an arteriovenous malformation pressing against the brain or spinal cord.  A condition that damages the peripheral nerves (peripheral neuropathy). Peripheral nerves are not part of the brain and spinal cord. These conditions include:  Diabetes.  Peripheral vascular disease.  Nerve entrapment syndromes, such as carpal tunnel syndrome.  Shingles.  Hypothyroidism.  Vitamin B12 deficiencies.  Alcoholism.  Heavy metal poisoning (lead, arsenic).  Rheumatoid  arthritis.  Systemic lupus erythematosus. DIAGNOSIS  Your caregiver will attempt to find the underlying cause of your paresthesia. Your caregiver may:  Take your medical history.  Perform a physical exam.  Order various lab tests.  Order imaging tests. TREATMENT  Treatment for paresthesia depends on the underlying cause. HOME CARE INSTRUCTIONS  Avoid drinking alcohol.  You may consider massage or acupuncture to help relieve your symptoms.  Keep all follow-up appointments as directed by your caregiver. SEEK IMMEDIATE MEDICAL CARE IF:   You feel weak.  You have trouble walking or moving.  You have problems with speech or vision.  You feel confused.  You cannot control your bladder or bowel movements.  You feel numbness after an injury.  You faint.  Your burning or prickling feeling gets worse when walking.  You have pain, cramps, or dizziness.  You develop a rash. MAKE SURE YOU:  Understand these instructions.  Will watch your condition.  Will get help right away if you are not doing well or get worse. Document Released: 05/28/2002 Document Revised: 08/30/2011 Document Reviewed: 02/26/2011 [facility] Patient Information 2015 [employer]Care, [place]LL[place]. This information is not intended to replace advice given to you by your health care provider. Make sure you discuss any questions you have with your health care provider.
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
## 2014-06-21 DIAGNOSIS — I639 Cerebral infarction, unspecified: Secondary | ICD-10-CM

## 2014-06-21 HISTORY — DX: Cerebral infarction, unspecified: I63.9

## 2014-08-23 ENCOUNTER — Emergency Department (HOSPITAL_COMMUNITY)
Admission: EM | Admit: 2014-08-23 | Discharge: 2014-08-23 | Disposition: A | Discharge status: Home / Self Care | Payer: No Typology Code available for payment source | Attending: Emergency Medicine | Admitting: Emergency Medicine

## 2014-08-23 ENCOUNTER — Encounter (HOSPITAL_COMMUNITY): Payer: Self-pay

## 2014-08-23 ENCOUNTER — Emergency Department (HOSPITAL_COMMUNITY): Payer: No Typology Code available for payment source

## 2014-08-23 DIAGNOSIS — K219 Gastro-esophageal reflux disease without esophagitis: Secondary | ICD-10-CM | POA: Insufficient documentation

## 2014-08-23 DIAGNOSIS — Z862 Personal history of diseases of the blood and blood-forming organs and certain disorders involving the immune mechanism: Secondary | ICD-10-CM | POA: Insufficient documentation

## 2014-08-23 DIAGNOSIS — F319 Bipolar disorder, unspecified: Secondary | ICD-10-CM | POA: Insufficient documentation

## 2014-08-23 DIAGNOSIS — Z720 Tobacco use: Secondary | ICD-10-CM | POA: Insufficient documentation

## 2014-08-23 DIAGNOSIS — Z79899 Other long term (current) drug therapy: Secondary | ICD-10-CM | POA: Insufficient documentation

## 2014-08-23 DIAGNOSIS — Z880 Allergy status to penicillin: Secondary | ICD-10-CM | POA: Insufficient documentation

## 2014-08-23 DIAGNOSIS — Z7982 Long term (current) use of aspirin: Secondary | ICD-10-CM | POA: Insufficient documentation

## 2014-08-23 DIAGNOSIS — Z8673 Personal history of transient ischemic attack (TIA), and cerebral infarction without residual deficits: Secondary | ICD-10-CM | POA: Insufficient documentation

## 2014-08-23 DIAGNOSIS — R531 Weakness: Secondary | ICD-10-CM

## 2014-08-23 DIAGNOSIS — M1990 Unspecified osteoarthritis, unspecified site: Secondary | ICD-10-CM | POA: Insufficient documentation

## 2014-08-23 DIAGNOSIS — Z7901 Long term (current) use of anticoagulants: Secondary | ICD-10-CM | POA: Insufficient documentation

## 2014-08-23 DIAGNOSIS — Z8639 Personal history of other endocrine, nutritional and metabolic disease: Secondary | ICD-10-CM | POA: Insufficient documentation

## 2014-08-23 DIAGNOSIS — F419 Anxiety disorder, unspecified: Secondary | ICD-10-CM | POA: Insufficient documentation

## 2014-08-23 LAB — URINALYSIS, ROUTINE W REFLEX MICROSCOPIC
Bilirubin Urine: NEGATIVE
Glucose, UA: NEGATIVE mg/dL
Hgb urine dipstick: NEGATIVE
Ketones, ur: NEGATIVE mg/dL
Leukocytes, UA: NEGATIVE
Nitrite: NEGATIVE
Protein, ur: NEGATIVE mg/dL
Specific Gravity, Urine: 1.01 (ref 1.005–1.030)
Urobilinogen, UA: 0.2 mg/dL (ref 0.0–1.0)
pH: 5.5 (ref 5.0–8.0)

## 2014-08-23 LAB — RAPID URINE DRUG SCREEN, HOSP PERFORMED
Amphetamines: NOT DETECTED
Barbiturates: NOT DETECTED
Benzodiazepines: NOT DETECTED
Cocaine: NOT DETECTED
Opiates: NOT DETECTED
Tetrahydrocannabinol: NOT DETECTED

## 2014-08-23 LAB — CBC WITH DIFFERENTIAL/PLATELET
Basophils Absolute: 0 10*3/uL (ref 0.0–0.1)
Basophils Relative: 0 % (ref 0–1)
Eosinophils Absolute: 0.1 10*3/uL (ref 0.0–0.7)
Eosinophils Relative: 2 % (ref 0–5)
HCT: 44.9 % (ref 39.0–52.0)
Hemoglobin: 14.9 g/dL (ref 13.0–17.0)
Lymphocytes Relative: 27 % (ref 12–46)
Lymphs Abs: 2.6 10*3/uL (ref 0.7–4.0)
MCH: 27.3 pg (ref 26.0–34.0)
MCHC: 33.2 g/dL (ref 30.0–36.0)
MCV: 82.4 fL (ref 78.0–100.0)
Monocytes Absolute: 0.5 10*3/uL (ref 0.1–1.0)
Monocytes Relative: 5 % (ref 3–12)
Neutro Abs: 6.3 10*3/uL (ref 1.7–7.7)
Neutrophils Relative %: 66 % (ref 43–77)
Platelets: 248 10*3/uL (ref 150–400)
RBC: 5.45 MIL/uL (ref 4.22–5.81)
RDW: 13.6 % (ref 11.5–15.5)
WBC: 9.6 10*3/uL (ref 4.0–10.5)

## 2014-08-23 LAB — BASIC METABOLIC PANEL
Anion gap: 6 (ref 5–15)
BUN: 7 mg/dL (ref 6–23)
CO2: 27 mmol/L (ref 19–32)
Calcium: 8.8 mg/dL (ref 8.4–10.5)
Chloride: 104 mmol/L (ref 96–112)
Creatinine, Ser: 0.9 mg/dL (ref 0.50–1.35)
GFR calc Af Amer: >90 mL/min (ref >90)
GFR calc non Af Amer: >90 mL/min (ref >90)
Glucose, Bld: 108 mg/dL — ABNORMAL HIGH (ref 70–99)
Potassium: 4.2 mmol/L (ref 3.5–5.1)
Sodium: 137 mmol/L (ref 135–145)

## 2014-08-23 NOTE — ED Notes (Signed)
Per [facility]: pt. Was at [employer] today when he fell x2. Uncertain regarding LOC/hitting head. Pt. States he has hx of TIA. Pt. States he has L sided weakness and ringing in L ear. Pt. Axo x4 at this time. Pt. Was lethargic today. No new deficits today. Denies pain.
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
## 2014-08-23 NOTE — ED Provider Notes (Signed)
5:22 PM Reassessed patient.  Patient continues to state that he is feeling some weakness of the left side of his body.  Patient alert and orientated x 3, Heart RRR, Lungs CTAB, CN II-XII intact, left grip strength 3/5, right grip strength 5/5, left lower extremity 3/5 muscle strength, 5/5 right LE strength.  However, patient does not seem to be putting forth full effort.  Symptoms present for the past 3 weeks.  Head CT is negative today.  Feel that the patient is stable for discharge.  Santiago GladHeather Delmar Dondero, PA-C 08/25/14 1331  Tilden FossaElizabeth Rees, MD 08/25/14 [phone]

## 2014-09-03 ENCOUNTER — Emergency Department (HOSPITAL_COMMUNITY)
Admission: EM | Admit: 2014-09-03 | Discharge: 2014-09-04 | Disposition: A | Discharge status: Other Acute Inpatient Hospital | Payer: No Typology Code available for payment source | Attending: Emergency Medicine | Admitting: Emergency Medicine

## 2014-09-03 ENCOUNTER — Encounter (HOSPITAL_COMMUNITY): Payer: Self-pay | Admitting: *Deleted

## 2014-09-03 DIAGNOSIS — M1990 Unspecified osteoarthritis, unspecified site: Secondary | ICD-10-CM | POA: Insufficient documentation

## 2014-09-03 DIAGNOSIS — Z8673 Personal history of transient ischemic attack (TIA), and cerebral infarction without residual deficits: Secondary | ICD-10-CM | POA: Insufficient documentation

## 2014-09-03 DIAGNOSIS — R45851 Suicidal ideations: Secondary | ICD-10-CM

## 2014-09-03 DIAGNOSIS — Z880 Allergy status to penicillin: Secondary | ICD-10-CM | POA: Insufficient documentation

## 2014-09-03 DIAGNOSIS — K219 Gastro-esophageal reflux disease without esophagitis: Secondary | ICD-10-CM | POA: Insufficient documentation

## 2014-09-03 DIAGNOSIS — Z79899 Other long term (current) drug therapy: Secondary | ICD-10-CM | POA: Insufficient documentation

## 2014-09-03 DIAGNOSIS — Z8659 Personal history of other mental and behavioral disorders: Secondary | ICD-10-CM | POA: Insufficient documentation

## 2014-09-03 DIAGNOSIS — Z8639 Personal history of other endocrine, nutritional and metabolic disease: Secondary | ICD-10-CM | POA: Insufficient documentation

## 2014-09-03 DIAGNOSIS — Z720 Tobacco use: Secondary | ICD-10-CM | POA: Insufficient documentation

## 2014-09-03 DIAGNOSIS — Z7982 Long term (current) use of aspirin: Secondary | ICD-10-CM | POA: Insufficient documentation

## 2014-09-03 DIAGNOSIS — F315 Bipolar disorder, current episode depressed, severe, with psychotic features: Secondary | ICD-10-CM

## 2014-09-03 DIAGNOSIS — Z862 Personal history of diseases of the blood and blood-forming organs and certain disorders involving the immune mechanism: Secondary | ICD-10-CM | POA: Insufficient documentation

## 2014-09-03 DIAGNOSIS — Z791 Long term (current) use of non-steroidal anti-inflammatories (NSAID): Secondary | ICD-10-CM | POA: Insufficient documentation

## 2014-09-03 HISTORY — DX: Cerebral infarction, unspecified: I63.9

## 2014-09-03 LAB — COMPREHENSIVE METABOLIC PANEL
ALT: 17 U/L (ref 0–53)
AST: 20 U/L (ref 0–37)
Albumin: 4.1 g/dL (ref 3.5–5.2)
Alkaline Phosphatase: 45 U/L (ref 39–117)
Anion gap: 10 (ref 5–15)
BILIRUBIN TOTAL: 0.5 mg/dL (ref 0.3–1.2)
BUN: 5 mg/dL — ABNORMAL LOW (ref 6–23)
CO2: 24 mmol/L (ref 19–32)
CREATININE: 0.9 mg/dL (ref 0.50–1.35)
Calcium: 9.4 mg/dL (ref 8.4–10.5)
Chloride: 104 mmol/L (ref 96–112)
GFR calc Af Amer: >90 mL/min (ref >90)
Glucose, Bld: 98 mg/dL (ref 70–99)
POTASSIUM: 3.7 mmol/L (ref 3.5–5.1)
Sodium: 138 mmol/L (ref 135–145)
Total Protein: 6.5 g/dL (ref 6.0–8.3)

## 2014-09-03 LAB — CBC
HCT: 44.8 % (ref 39.0–52.0)
Hemoglobin: 14.7 g/dL (ref 13.0–17.0)
MCH: 27 pg (ref 26.0–34.0)
MCHC: 32.8 g/dL (ref 30.0–36.0)
MCV: 82.2 fL (ref 78.0–100.0)
PLATELETS: 282 10*3/uL (ref 150–400)
RBC: 5.45 MIL/uL (ref 4.22–5.81)
RDW: 13.4 % (ref 11.5–15.5)
WBC: 9.2 10*3/uL (ref 4.0–10.5)

## 2014-09-03 LAB — RAPID URINE DRUG SCREEN, HOSP PERFORMED
AMPHETAMINES: NOT DETECTED
BARBITURATES: NOT DETECTED
Benzodiazepines: NOT DETECTED
Cocaine: NOT DETECTED
Opiates: NOT DETECTED
Tetrahydrocannabinol: NOT DETECTED

## 2014-09-03 LAB — ACETAMINOPHEN LEVEL: Acetaminophen (Tylenol), Serum: <10 ug/mL — ABNORMAL LOW (ref 10–30)

## 2014-09-03 LAB — SALICYLATE LEVEL: Salicylate Lvl: <4 mg/dL (ref 2.8–20.0)

## 2014-09-03 LAB — ETHANOL: Alcohol, Ethyl (B): <5 mg/dL (ref 0–9)

## 2014-09-03 MED ORDER — ACETAMINOPHEN 500 MG PO TABS: 1000.0000 mg | ORAL_TABLET | Freq: Four times a day (QID) | ORAL | Status: DC | PRN

## 2014-09-03 MED ORDER — ASPIRIN EC 81 MG PO TBEC
81.0000 mg | DELAYED_RELEASE_TABLET | Freq: Every day | ORAL | Status: DC
  Administered 2014-09-04: 81 mg via ORAL
  Filled 2014-09-03: qty 1

## 2014-09-03 MED ORDER — TRAZODONE HCL 100 MG PO TABS
100.0000 mg | ORAL_TABLET | Freq: Every day | ORAL | Status: DC
  Administered 2014-09-03: 100 mg via ORAL
  Filled 2014-09-03: qty 1
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
## 2014-09-03 NOTE — ED Notes (Signed)
Pt reports SI and AH. Denies HI. Pt reports hx of Bipolar and PTSD. Reports SI thoughts x2 weeks. Plan to OD on his sleeping pills. Hx of SI attempts. Sober ETOH 6 months, sober drugs 2 months. Uses cigarettes. Denies physical pain. Uses a cane to walk, but can walk short distances without cane.

## 2014-09-03 NOTE — BH Assessment (Signed)
Informed pt's father Jonny RuizJohn, [phone] of plan per pt request.   Clista BernhardtNancy Elynor Kallenberger, [facility] Triage Specialist 09/03/2014 8:46 PM

## 2014-09-03 NOTE — BH Assessment (Signed)
Pt with SI and AH with thoughts of suicide via sleeping pills for past two weeks. Per EDP notes pt is sober from etoh 6 months and sober from drugs for two months. Labs are pending. Pt has had hallucinations since childhood.   Assessment to commence shortly.    Clista BernhardtNancy Davier Tramell, [facility] Triage Specialist 09/03/2014 7:32 PM
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
## 2014-09-04 ENCOUNTER — Encounter (HOSPITAL_COMMUNITY): Payer: Self-pay

## 2014-09-04 ENCOUNTER — Inpatient Hospital Stay (HOSPITAL_COMMUNITY)
Admission: EM | Admit: 2014-09-04 | Discharge: 2014-09-09 | DRG: 885 | Disposition: A | Discharge status: Home / Self Care | Payer: Federal, State, Local not specified - Other | Source: Intra-hospital | Attending: Psychiatry | Admitting: Psychiatry

## 2014-09-04 ENCOUNTER — Encounter (HOSPITAL_COMMUNITY): Payer: Self-pay | Admitting: Registered Nurse

## 2014-09-04 DIAGNOSIS — R45851 Suicidal ideations: Secondary | ICD-10-CM | POA: Diagnosis present

## 2014-09-04 DIAGNOSIS — F603 Borderline personality disorder: Secondary | ICD-10-CM | POA: Diagnosis present

## 2014-09-04 DIAGNOSIS — F1290 Cannabis use, unspecified, uncomplicated: Secondary | ICD-10-CM | POA: Diagnosis present

## 2014-09-04 DIAGNOSIS — F17210 Nicotine dependence, cigarettes, uncomplicated: Secondary | ICD-10-CM | POA: Diagnosis present

## 2014-09-04 DIAGNOSIS — E785 Hyperlipidemia, unspecified: Secondary | ICD-10-CM | POA: Diagnosis present

## 2014-09-04 DIAGNOSIS — F4310 Post-traumatic stress disorder, unspecified: Secondary | ICD-10-CM | POA: Diagnosis present

## 2014-09-04 DIAGNOSIS — F1221 Cannabis dependence, in remission: Secondary | ICD-10-CM | POA: Diagnosis present

## 2014-09-04 DIAGNOSIS — Z79899 Other long term (current) drug therapy: Secondary | ICD-10-CM | POA: Diagnosis not present

## 2014-09-04 DIAGNOSIS — Z7982 Long term (current) use of aspirin: Secondary | ICD-10-CM

## 2014-09-04 DIAGNOSIS — F250 Schizoaffective disorder, bipolar type: Secondary | ICD-10-CM | POA: Diagnosis present

## 2014-09-04 DIAGNOSIS — F259 Schizoaffective disorder, unspecified: Secondary | ICD-10-CM | POA: Diagnosis present

## 2014-09-04 DIAGNOSIS — F315 Bipolar disorder, current episode depressed, severe, with psychotic features: Secondary | ICD-10-CM | POA: Diagnosis present

## 2014-09-04 LAB — GLUCOSE, CAPILLARY
GLUCOSE-CAPILLARY: 106 mg/dL — AB (ref 70–99)
Glucose-Capillary: 93 mg/dL (ref 70–99)

## 2014-09-04 MED ORDER — TRAZODONE HCL 100 MG PO TABS
100.0000 mg | ORAL_TABLET | Freq: Every day | ORAL | Status: DC
  Administered 2014-09-04 – 2014-09-08 (×5): 100 mg via ORAL
  Filled 2014-09-04 (×2): qty 1
  Filled 2014-09-04: qty 14
  Filled 2014-09-04 (×6): qty 1

## 2014-09-04 MED ORDER — FLUPHENAZINE HCL 5 MG PO TABS
5.0000 mg | ORAL_TABLET | ORAL | Status: DC
  Administered 2014-09-04 – 2014-09-05 (×2): 5 mg via ORAL
  Filled 2014-09-04 (×6): qty 1

## 2014-09-04 MED ORDER — TERAZOSIN HCL 5 MG PO CAPS
5.0000 mg | ORAL_CAPSULE | Freq: Every day | ORAL | Status: DC
Start: 2014-09-04 — End: 2014-09-04
  Filled 2014-09-04: qty 1

## 2014-09-04 MED ORDER — BENZTROPINE MESYLATE 1 MG PO TABS
1.0000 mg | ORAL_TABLET | ORAL | Status: DC
  Administered 2014-09-04 – 2014-09-09 (×10): 1 mg via ORAL
  Filled 2014-09-04 (×5): qty 1
  Filled 2014-09-04: qty 28
  Filled 2014-09-04 (×2): qty 1
  Filled 2014-09-04: qty 28
  Filled 2014-09-04 (×6): qty 1

## 2014-09-04 MED ORDER — BUSPIRONE HCL 10 MG PO TABS
5.0000 mg | ORAL_TABLET | ORAL | Status: DC
  Administered 2014-09-04: 5 mg via ORAL
  Filled 2014-09-04: qty 1

## 2014-09-04 MED ORDER — PANTOPRAZOLE SODIUM 40 MG PO TBEC
40.0000 mg | DELAYED_RELEASE_TABLET | Freq: Every day | ORAL | Status: DC
  Administered 2014-09-04: 40 mg via ORAL
  Filled 2014-09-04: qty 1

## 2014-09-04 MED ORDER — ASPIRIN EC 81 MG PO TBEC
81.0000 mg | DELAYED_RELEASE_TABLET | Freq: Every day | ORAL | Status: DC
  Administered 2014-09-04 – 2014-09-09 (×6): 81 mg via ORAL
  Filled 2014-09-04 (×6): qty 1
  Filled 2014-09-04: qty 14
  Filled 2014-09-04 (×4): qty 1

## 2014-09-04 MED ORDER — ALUM & MAG HYDROXIDE-SIMETH 200-200-20 MG/5ML PO SUSP: 30.0000 mL | ORAL | Status: DC | PRN

## 2014-09-04 MED ORDER — RISPERIDONE 2 MG PO TBDP: 2.0000 mg | ORAL_TABLET | Freq: Three times a day (TID) | ORAL | Status: DC | PRN

## 2014-09-04 MED ORDER — ACETAMINOPHEN 325 MG PO TABS: 650.0000 mg | ORAL_TABLET | Freq: Four times a day (QID) | ORAL | Status: DC | PRN

## 2014-09-04 MED ORDER — VITAMIN D3 25 MCG (1000 UNIT) PO TABS
1000.0000 [IU] | ORAL_TABLET | Freq: Two times a day (BID) | ORAL | Status: DC
  Administered 2014-09-04: 1000 [IU] via ORAL
  Filled 2014-09-04 (×2): qty 1

## 2014-09-04 MED ORDER — OXCARBAZEPINE 300 MG PO TABS
600.0000 mg | ORAL_TABLET | Freq: Two times a day (BID) | ORAL | Status: DC
  Administered 2014-09-04: 600 mg via ORAL
  Filled 2014-09-04 (×2): qty 2

## 2014-09-04 MED ORDER — LORAZEPAM 1 MG PO TABS: 1.0000 mg | ORAL_TABLET | ORAL | Status: DC | PRN

## 2014-09-04 MED ORDER — OXCARBAZEPINE 150 MG PO TABS
150.0000 mg | ORAL_TABLET | Freq: Two times a day (BID) | ORAL | Status: DC
  Administered 2014-09-04 – 2014-09-09 (×10): 150 mg via ORAL
  Filled 2014-09-04: qty 28
  Filled 2014-09-04 (×10): qty 1
  Filled 2014-09-04: qty 28
  Filled 2014-09-04 (×2): qty 1

## 2014-09-04 MED ORDER — MAGNESIUM HYDROXIDE 400 MG/5ML PO SUSP: 30.0000 mL | Freq: Every day | ORAL | Status: DC | PRN

## 2014-09-04 MED ORDER — ZIPRASIDONE MESYLATE 20 MG IM SOLR: 20.0000 mg | INTRAMUSCULAR | Status: DC | PRN

## 2014-09-04 MED ORDER — NICOTINE 21 MG/24HR TD PT24
21.0000 mg | MEDICATED_PATCH | Freq: Every day | TRANSDERMAL | Status: DC
  Administered 2014-09-04 – 2014-09-09 (×6): 21 mg via TRANSDERMAL
  Filled 2014-09-04 (×9): qty 1

## 2014-09-04 MED ORDER — HYDROXYZINE HCL 25 MG PO TABS
25.0000 mg | ORAL_TABLET | Freq: Four times a day (QID) | ORAL | Status: DC | PRN
  Administered 2014-09-05: 25 mg via ORAL
  Filled 2014-09-04: qty 1
  Filled 2014-09-04: qty 20

## 2014-09-04 MED ORDER — GABAPENTIN 100 MG PO CAPS
100.0000 mg | ORAL_CAPSULE | ORAL | Status: DC
  Administered 2014-09-04 – 2014-09-09 (×14): 100 mg via ORAL
  Filled 2014-09-04: qty 42
  Filled 2014-09-04 (×7): qty 1
  Filled 2014-09-04: qty 42
  Filled 2014-09-04: qty 1
  Filled 2014-09-04: qty 42
  Filled 2014-09-04 (×13): qty 1

## 2014-09-04 NOTE — ED Notes (Signed)
2 pt belonging bags placed in locker #[id number]

## 2014-09-04 NOTE — Progress Notes (Signed)
Pt admitted on the 500 hall today. Pt alert and oriented. Pt calm and cooperative. Pt uses a cane to walk. Pt reports having TIAs last November causing weakness. Pt denies any current SI/HI. Pt does report AH and some VH. Pt says it seems like he spots are behind him at times. Pt has been compliant and appropriate in the milieu. Pt denies pain. No current concerns. Staff will continue to monitor.

## 2014-09-04 NOTE — BH Assessment (Signed)
[facility] is [facility] acute [facility] available. Per Donell SievertSpencer Simon, PA [facility] Pt will be placed in room 504-2 under the care of Dr. Elna BreslowEappen to arrive between after 8 am report to be called to [id number]. Informed RN.   Clista BernhardtNancy Mike Berntsen, [facility] Triage Specialist 09/04/2014 3:00 AM
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
## 2014-09-04 NOTE — [facility] Suicide Risk Assessment (Signed)
[facility]   Nursing information obtained from:    Demographic factors:    Current Mental Status:    Loss Factors:    Historical Factors:    Risk Reduction Factors:    Total Time spent with patient: 30 minutes Principal Problem: Schizoaffective disorder, bipolar type Diagnosis:   Patient Active Problem List   Diagnosis Date Noted  . Schizoaffective disorder, bipolar type [F25.0] 09/04/2014  . PTSD (post-traumatic stress disorder) [F43.10] 09/04/2014  . Borderline personality disorder [F60.3] 09/04/2014  . Slurred speech [R47.81]   . Faintness [R55]   . Syncope [R55] 05/01/2014  . Diabetes mellitus [E11.9] 05/01/2014  . TIA (transient ischemic attack) [G45.9] 05/01/2014  . Left-sided weakness [M62.89]      Continued Clinical Symptoms:  Alcohol Use Disorder Identification Test Final Score (AUDIT): 0 The "Alcohol Use Disorders Identification Test", Guidelines for Use in Primary Care, Second Edition.  World Science writerHealth Organization [facility](WHO). Score between 0-7:  no or low risk or alcohol related problems. Score between 8-15:  moderate risk of alcohol related problems. Score between 16-19:  high risk of alcohol related problems. Score 20 or above:  warrants further diagnostic evaluation for alcohol dependence and treatment.   CLINICAL FACTORS:    Personality Disorders:   Cluster B Previous Psychiatric Diagnoses and Treatments Medical Diagnoses and Treatments/Surgeries   Musculoskeletal: Strength & Muscle Tone: within normal limits Gait & Station: walks with cane Patient leans: N/A  Psychiatric Specialty Exam: Physical Exam  Review of Systems  Psychiatric/Behavioral: Positive for depression, suicidal ideas and hallucinations. The patient is nervous/anxious and has insomnia.     Blood pressure 116/58, pulse 73, temperature 98.5 F (36.9 C), temperature source Oral, resp. rate 18, height 5' 5.5" (1.664 m), weight 89.018 kg (196 lb 4 oz).Body mass index  is 32.15 kg/(m^2).  General Appearance: Fairly Groomed  Patent attorneyye Contact::  Good  Speech:  Clear and Coherent  Volume:  Normal  Mood:  Anxious and Depressed  Affect:  Appropriate  Thought Process:  Coherent  Orientation:  Full (Time, Place, and Person)  Thought Content:  Hallucinations: Auditory  Suicidal Thoughts:  Yes.  without intent/plan  Homicidal Thoughts:  No  Memory:  Immediate;   Fair Recent;   Fair Remote;   Fair  Judgement:  Fair  Insight:  Fair  Psychomotor Activity:  Normal  Concentration:  Fair  Recall:  [employer]  Fund of Knowledge:Fair  Language: Fair  Akathisia:  Negative  Handed:  Right  AIMS (if indicated):     Assets:  Communication Skills Desire for Improvement  Sleep:     Cognition: WNL  ADL's:  Intact     COGNITIVE FEATURES THAT CONTRIBUTE TO RISK:  Closed-mindedness, Polarized thinking and Thought constriction (tunnel vision)    SUICIDE RISK:   Severe:  Frequent, intense, and enduring suicidal ideation, specific plan, no subjective intent, but some objective markers of intent (i.e., choice of lethal method), the method is accessible, some limited preparatory behavior, evidence of impaired self-control, severe dysphoria/symptomatology, multiple risk factors present, and few if any protective factors, particularly a lack of social support.  PLAN OF CARE:Patient will benefit from inpatient treatment and stabilization.  Estimated length of stay is 5-7 days.  Reviewed past medical records,treatment plan.  Will continue to monitor vitals ,medication compliance and treatment side effects while patient is here.  Will monitor for medical issues as well as call consult as needed.  Reviewed labs ,will order as needed.  CSW will start working on disposition.  Patient to participate in therapeutic milieu .       Medical Decision Making:  Review of Psycho-Social Stressors (1), Review or order clinical lab tests (1), Review and summation of old records (2),  Established Problem, Worsening (2), Review or order medicine tests (1) and Review of New Medication or Change in Dosage (2)  I certify that inpatient services furnished can reasonably be expected to improve the patient's condition.   Alben Jepsen MD 09/04/2014, 2:41 PM

## 2014-09-04 NOTE — [facility] Group Notes (Signed)
[facility] LCSW Group Therapy  09/04/2014 1:20 PM  Type of Therapy:  Group Therapy  Participation Level:  Active   Participation Quality:  Attentive  Affect:  Depressed  Cognitive:  Alert  Insight:  Limited  Engagement in Therapy:  Limited  Modes of Intervention:  Discussion, Education, Socialization and Support  Summary of Progress/Problems:Mental Health Association (MHA) speaker came to talk about his personal journey with substance abuse and mental illness. Group members were challenged to process ways by which to relate to the speaker. MHA speaker provided handouts and educational information pertaining to groups and services offered by [facility]. Alex Huaavid attended group and stayed the entire time. "I started using drugs just for the hell of it. Now, I use it to cover up the voices."     Turner,Alex 09/04/2014, 1:20 PM

## 2014-09-04 NOTE — BH Assessment (Signed)
Seeking placement, [facility]  [place]  Alex Turner, [place] Triage Specialist 09/04/2014 12:33 AM

## 2014-09-04 NOTE — ED Provider Notes (Signed)
Per Harriett SineNancy with TTS, [facility] and can be transported after 8:00 am. Accepting MD - Dr. Violet BaldyAeppan. He is voluntary, can transport by [facility].  Antoria Lanza, PA-C 09/04/14 0303  April Palumbo, MD 09/04/14 [phone]

## 2014-09-05 DIAGNOSIS — E785 Hyperlipidemia, unspecified: Secondary | ICD-10-CM | POA: Diagnosis not present

## 2014-09-05 LAB — LIPID PANEL
CHOL/HDL RATIO: 6.8 ratio
CHOLESTEROL: 217 mg/dL — AB (ref 0–200)
HDL: 32 mg/dL — AB (ref >39)
LDL CALC: 133 mg/dL — AB (ref 0–99)
Triglycerides: 259 mg/dL — ABNORMAL HIGH (ref <150)
VLDL: 52 mg/dL — ABNORMAL HIGH (ref 0–40)

## 2014-09-05 LAB — GLUCOSE, CAPILLARY
GLUCOSE-CAPILLARY: 106 mg/dL — AB (ref 70–99)
GLUCOSE-CAPILLARY: 100 mg/dL — AB (ref 70–99)
Glucose-Capillary: 105 mg/dL — ABNORMAL HIGH (ref 70–99)
Glucose-Capillary: 110 mg/dL — ABNORMAL HIGH (ref 70–99)

## 2014-09-05 LAB — TSH: TSH: 4.492 u[IU]/mL (ref 0.350–4.500)

## 2014-09-05 MED ORDER — FLUPHENAZINE HCL 5 MG PO TABS
7.5000 mg | ORAL_TABLET | ORAL | Status: DC
  Administered 2014-09-05 – 2014-09-09 (×8): 7.5 mg via ORAL
  Filled 2014-09-05 (×12): qty 1

## 2014-09-05 MED ORDER — SIMVASTATIN 20 MG PO TABS
20.0000 mg | ORAL_TABLET | Freq: Every day | ORAL | Status: DC
  Administered 2014-09-05 – 2014-09-08 (×4): 20 mg via ORAL
  Filled 2014-09-05 (×2): qty 1
  Filled 2014-09-05: qty 14
  Filled 2014-09-05 (×4): qty 1

## 2014-09-05 NOTE — Progress Notes (Signed)
D   Pt is pleasant on approach and cooperative   He said his anxiety has improved since being moved off of the 500 hall   He attended karaoke group and reported even though he didn't sing he enjoyed it    A   Verbal support given   Medications administered and effectiveness monitored   Q 15 min checks R   Pt safe at present

## 2014-09-05 NOTE — Progress Notes (Signed)
D. Pt had been up and visible in milieu this evening, did attend and participate in evening group activity. Pt spoke about being here for anxiety and auditory hallucinations and has appeared anxious and fidgety in the milieu. Pt did receive all medications without incident this evening, and did not report any complaints of pain. A. Support and encouragement provided. R. Safety maintained, will continue to monitor.

## 2014-09-05 NOTE — BHH Group Notes (Signed)
Adult Psychoeducational Group Note  Date:  09/05/2014 Time:  1:34 AM  Group Topic/Focus:  Wrap-Up Group:   The focus of this group is to help patients review their daily goal of treatment and discuss progress on daily workbooks.  Participation Level:  Active  Participation Quality:  Appropriate  Affect:  Appropriate  Cognitive:  Appropriate  Insight: Appropriate  Engagement in Group:  Engaged  Modes of Intervention:  Discussion  Additional Comments:  [name] stated that this was his first day here.  He expressed that he was agitated with one of his peers because they were being loud and obnoxious.  He expressed that he isn't one to hold his tongue so as long as the peer didn't bother him, he would be fine.   Caroll RancherLindsay, Pennelope Basque A 09/05/2014, 1:34 AM

## 2014-09-05 NOTE — Plan of Care (Signed)
Problem: Consults Goal: Anxiety Disorder Patient Education See Patient Education Module for eduction specifics.  Outcome: Completed/Met Date Met:  09/05/14 Nurse discussed anxiety with pt.

## 2014-09-05 NOTE — BHH Group Notes (Signed)
The focus of this group is to educate the patient on the purpose and policies of crisis stabilization and provide a format to answer questions about their admission.  The group details unit policies and expectations of patients while admitted.  Patient attended 0900 nurse education orientation group this morning.  Patient actively participated, appropriate affect, alert, appropriate insight and engagement.  Today patient will work on 3 goals for discharge.  
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
## 2014-09-05 NOTE — Progress Notes (Signed)
D:  Patient's self inventory sheet, patient has fair sleep, sleep medication is helpful.  Fair appetite, normal energy level, good concentration.  Rated depression and hopeless 8, anxiety 10.  Denied withdrawals.  Has felt irritable today.  Denied SI on form.  Physical problems today are lightheadedness, dizziness, headaches.  Physical pain #5 worst.  Goal is to work on anxiety and temper.  Plans to get away from this hall because stress being around a certain pt is through the roof.  No discharge plans.  No problems anticipated after discharge. A:  Medications administered per MD orders.  Emotional support and encouragement given patient. R:  Denied HI.  Hearing voices to walk away and hurt another that is causing him stress on unit.  Contracts for safety.  Stated he does sees light flashes.  SI because of stress on this unit with a patient always talking.  Contracts for safety verbal. CBG at noon was 105.

## 2014-09-05 NOTE — Progress Notes (Signed)
Recreation Therapy Notes  Animal-Assisted Activity/Therapy (AAA/T) Program Checklist/Progress Notes Patient Eligibility Criteria Checklist & Daily Group note for Rec Tx Intervention  Date: 03.17.2016 Time: 2:45pm Location: 400 Hall Dayroom   AAA/T Program Assumption of Risk Form signed by Patient/ or Parent Legal Guardian yes  Patient is free of allergies or sever asthma yes  Patient reports no fear of animals yes  Patient reports no history of cruelty to animals yes  Patient understands his/her participation is voluntary yes  Patient washes hands before animal contact yes  Patient washes hands after animal contact yes  Behavioral Response: Attentive, Appropriate   Education: Hand Washing, Appropriate Animal Interaction   Education Outcome: Acknowledges education.   Clinical Observations/Feedback: Patient interacted appropriately with dog team and peers during session.   Kaimana Lurz L Latarsha Zani, LRT/CTRS  Juline Sanderford L 09/05/2014 5:26 PM 

## 2014-09-06 LAB — HEMOGLOBIN A1C
Hgb A1c MFr Bld: 5.8 % — ABNORMAL HIGH (ref 4.8–5.6)
Mean Plasma Glucose: 120 mg/dL

## 2014-09-06 LAB — GLUCOSE, CAPILLARY
GLUCOSE-CAPILLARY: 103 mg/dL — AB (ref 70–99)
GLUCOSE-CAPILLARY: 111 mg/dL — AB (ref 70–99)
Glucose-Capillary: 124 mg/dL — ABNORMAL HIGH (ref 70–99)
Glucose-Capillary: 99 mg/dL (ref 70–99)

## 2014-09-06 NOTE — [facility] Group Notes (Signed)
[facility] Note   09/06/2014 10:19 AM  Participation Quality:  Appropriate   Mood/Affect:  Appropriate  Depression Rating:  3  Anxiety Rating:  5 "it fluctuates."   Thoughts of Suicide:  No Will you contract for safety?   NA  Current AVH:  No  Plan for Discharge/Comments: Pt reports some mood instability (anxiety) and states that he was recently started on new meds. Pt reports that he is able to return home with his father at d/c. [facility] and needs med management and therapy appts scheduled. CSW assessing. Pt pleasant and calm during group. He requested info for low income housing and reports that his disability claim is pending.    Transportation Means: bus   Supports: father  Counselling psychologistmart, Lebron QuamHeather LCSWA
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
## 2014-09-06 NOTE — Progress Notes (Signed)
EKG completed

## 2014-09-06 NOTE — Evaluation (Signed)
Physical Therapy Evaluation Patient Details Name: Alex BroadDavid M Neely MRN: [id number] DOB: 08/21/1970 Today's Date: 09/06/2014   History of Present Illness  44 yo male [facility] with schizoaffective disorder, bipolar, suicidal ideation.  Clinical Impression  Patient is ambulatory with a SPC, does not require a RW  As noted on today's assessment. Patient will benefit from a neuromuscular reed exercise program. Instructed in 5 exercises today. Will follow up with 1 more visit for HEP.    Follow Up Recommendations Outpatient PT (patient reports no insurance)    Equipment Recommendations  None recommended by PT    Recommendations for Other Services       Precautions / Restrictions Precautions Precautions: Fall      Mobility  Bed Mobility Overal bed mobility: Independent                Transfers Overall transfer level: Modified independent                  Ambulation/Gait Ambulation/Gait assistance: Modified independent (Device/Increase time) Ambulation Distance (Feet): 200 Feet Assistive device: Rolling walker (2 wheeled);Straight cane Gait Pattern/deviations: Step-to pattern;Step-through pattern;Decreased weight shift to left     General Gait Details: patient uses SPC on the  Left, had patient place on right but patient preferred Left, patient lurches to the L during stance, noted dexcreased swing of L leg, rigid in movement.  Stairs            Wheelchair Mobility    Modified Rankin (Stroke Patients Only)       Balance                                             Pertinent Vitals/Pain Pain Assessment: 0-10 Pain Score: 5  Pain Location: L knee Pain Descriptors / Indicators: Cramping;Grimacing Pain Intervention(s): Premedicated before session    Home Living Family/patient expects to be discharged to:: Private residence Living Arrangements: Parent Available Help at Discharge: Family Type of Home: House        Home Layout: One level;Other (Comment) Home Equipment: Cane - single point;Crutches;Walker - standard      Prior Function Level of Independence: Independent with assistive device(s)               Hand Dominance        Extremity/Trunk Assessment   Upper Extremity Assessment: LUE deficits/detail       LUE Deficits / Details: decreased  shoulder elevation, decreased grip strength   Lower Extremity Assessment: LLE deficits/detail   LLE Deficits / Details: hip flexion 3, knee extension 4, dorsiflexion 4/, decreased control of movement in flexion of hip, movement is stiff and rigid, slow movements when going from flexion to extension     Communication   Communication: No difficulties  Cognition Arousal/Alertness: Awake/alert Behavior During Therapy: WFL for tasks assessed/performed Overall Cognitive Status: Within Functional Limits for tasks assessed                      General Comments      Exercises Other Exercises Other Exercises: supine with LLE over edge of bed, practiced hip/knee flexion to  place foot onto be  then to floor, x 10 Other Exercises:  standing  L knee flexion, hip abduction,  hip flexion  x 10      Assessment/Plan    PT Assessment Patient  needs continued PT services  PT Diagnosis Abnormality of gait   PT Problem List Decreased strength;Decreased coordination  PT Treatment Interventions Therapeutic activities;Neuromuscular re-education   PT Goals (Current goals can be found in the Care Plan section) Acute Rehab PT Goals Patient Stated Goal: to walk better Time For Goal Achievement: 09/13/14 Potential to Achieve Goals: Good    Frequency Min 1X/week   Barriers to discharge        Co-evaluation               End of Session   Activity Tolerance: Patient tolerated treatment well Patient left:  (up in room)      Functional Assessment Tool Used: clinical judgement Functional Limitation: Mobility: Walking and moving  around Mobility: Walking and Moving Around Current Status ([id number]): At least 1 percent but less than 20 percent impaired, limited or restricted Mobility: Walking and Moving Around Goal Status [phone]): At least 1 percent but less than 20 percent impaired, limited or restricted Mobility: Walking and Moving Around Discharge Status [phone]): At least 1 percent but less than 20 percent impaired, limited or restricted    Time: 1450-1510 PT Time Calculation (min) (ACUTE ONLY): 20 min   Charges:   PT Evaluation $Initial PT Evaluation Tier I: 1 Procedure     PT G Codes:   PT G-Codes **NOT FOR INPATIENT CLASS** Functional Assessment Tool Used: clinical judgement Functional Limitation: Mobility: Walking and moving around Mobility: Walking and Moving Around Current Status ([id number]): At least 1 percent but less than 20 percent impaired, limited or restricted Mobility: Walking and Moving Around Goal Status [phone]): At least 1 percent but less than 20 percent impaired, limited or restricted Mobility: Walking and Moving Around Discharge Status [phone]): At least 1 percent but less than 20 percent impaired, limited or restricted    Rada Hay 09/06/2014, 3:51 PM Blanchard Kelch PT [phone]

## 2014-09-06 NOTE — Progress Notes (Signed)
Recreation Therapy Notes  Date: 03.18.2016 Time: 9:30am Location: [address] Group Room   Group Topic: Stress Management  Goal Area(s) Addresses:  Patient will actively participate in stress management techniques presented during session.   Behavioral Response: Engaged, Attentive.   Intervention: Stress Management   Activity :  Deep Breathing and Progressive Muscle Relaxation   Education:  Stress Management, Discharge Planning.   Education Outcome: Acknowledges education  Clinical Observations/Feedback: Engaged in both deep breathing and progressive muscle relaxation, demonstrating ability to practice independently post d/c.    Marykay Lexenise L Slater Mcmanaman, LRT/CTRS  Jearl KlinefelterBlanchfield, Katharyn Schauer L 09/06/2014 4:27 PM

## 2014-09-06 NOTE — [facility] Group Notes (Signed)
[facility] LCSW Group Therapy  09/06/2014 3:13 PM  Type of Therapy:  Group Therapy  Participation Level:  Active  Participation Quality:  Attentive  Affect:  Appropriate  Cognitive:  Alert and Oriented  Insight:  Improving  Engagement in Therapy:  Engaged  Modes of Intervention:  Confrontation, Discussion, Education, Exploration, Problem-solving, Rapport Building, Socialization and Support  Summary of Progress/Problems: Feelings around Relapse. Group members discussed the meaning of relapse and shared personal stories of relapse, how it affected them and others, and how they perceived themselves during this time. Group members were encouraged to identify triggers, warning signs and coping skills used when facing the possibility of relapse. Social supports were discussed and explored in detail. Post Acute Withdrawal Syndrome (handout provided) was introduced and examined. Pt's were encouraged to ask questions, talk about key points associated with PAWS, and process this information in terms of relapse prevention. Alex Turner was attentive and engaged during today's processing group. He shared his experiences in dealing with PAWS and how he did not understand what was going on with him. Alex Turner talked about the power of having supports and AA when in early recovery. He shared that this helped him to remain sober for the past six months (regarding alcohol). Alex Turner continues to show progress in the group setting and improving insight.     Alex Turner, Alex Turner  LCSWA  09/06/2014, 3:13 PM

## 2014-09-06 NOTE — Progress Notes (Signed)
Alex Turner cont to do well. He is pleasant, calm and friendly...making frequent jokes with this nurse.   A He takes his meds as scheduled and he completes his morning assessment and on it he writes he denies SI and rates his depression, hopelessness and anxiety " 5/3".   R Safety is in place.

## 2014-09-06 NOTE — [facility] Suicide Risk Assessment (Signed)
[facility] INPATIENT:  Family/Significant Other Suicide Prevention Education  Suicide Prevention Education:  Education Completed; Fulton MoleJohn Rasnic (pt's father) [phone] has been identified by the patient as the family member/significant other with whom the patient will be residing, and identified as the person(s) who will aid the patient in the event of a mental health crisis (suicidal ideations/suicide attempt).  With written consent from the patient, the family member/significant other has been provided the following suicide prevention education, prior to the and/or following the discharge of the patient.  The suicide prevention education provided includes the following:  Suicide risk factors  Suicide prevention and interventions  National Suicide Hotline telephone number  [facility] assessment telephone number  [facility] Emergency Assistance 911  [facility] and/or Residential Mobile Crisis Unit telephone number  Request made of family/significant other to:  Remove weapons (e.g., guns, rifles, knives), all items previously/currently identified as safety concern.    Remove drugs/medications (over-the-counter, prescriptions, illicit drugs), all items previously/currently identified as a safety concern.  The family member/significant other verbalizes understanding of the suicide prevention education information provided.  The family member/significant other agrees to remove the items of safety concern listed above.  Smart, Hyun Marsalis LCSWA  09/06/2014, 3:40 PM   Pt's father voiced no concerns regarding pt returning home at d/c. He verbalized understanding of all SPE information presented.  The Sherwin-WilliamsHeather Smart, LCSWA 09/06/2014 3:41 PM

## 2014-09-06 NOTE — Plan of Care (Signed)
Problem: Food- and Nutrition-Related Knowledge Deficit (NB-1.1) Goal: Nutrition education Formal process to instruct or train a patient/client in a skill or to impart knowledge to help patients/clients voluntarily manage or modify food choices and eating behavior to maintain or improve health. Outcome: Completed/Met Date Met:  09/06/14 Nutrition Education Note  RD consulted for nutrition education regarding hyperlipidemia.  Lipid Panel     Component Value Date/Time    CHOL 217* 09/05/2014 0627    TRIG 259* 09/05/2014 0627    HDL 32* 09/05/2014 0627    CHOLHDL 6.8 09/05/2014 0627    VLDL 52* 09/05/2014 0627    LDLCALC 133* 09/05/2014 0627    RD provided "High Triglycerides Nutrition Therapy" handout from the Academy of Nutrition and Dietetics. Reviewed patient's dietary recall. Provided examples on ways to decrease sugar and fat intake in diet. Discouraged intake of processed foods and consumption of sugar-sweetened beverages. Encouraged fresh fruits and vegetables as well as whole grain sources of carbohydrates to maximize fiber intake. Teach back method used.  Expect fair compliance. Pt needs more motivation to make changes. Pt laughed and called himself a "carb-aholic". States these dietary changes will be hard to follow. However, pt has already stopped drinking sugary beverages and limits his intake of fried foods.  Body mass index is 32.15 kg/(m^2). Pt meets criteria for obesity based on current BMI.  Diet Order: Diet Heart Pt is also offered choice of unit snacks mid-morning and mid-afternoon.  Pt is eating as desired.  Labs and medications reviewed. No further nutrition interventions warranted at this time. If additional nutrition issues arise, please re-consult RD.  Clayton Bibles, MS, RD, LDN Pager: [phone] After Hours Pager: [phone]

## 2014-09-07 DIAGNOSIS — F1290 Cannabis use, unspecified, uncomplicated: Secondary | ICD-10-CM

## 2014-09-07 DIAGNOSIS — F250 Schizoaffective disorder, bipolar type: Principal | ICD-10-CM

## 2014-09-07 DIAGNOSIS — F603 Borderline personality disorder: Secondary | ICD-10-CM

## 2014-09-07 DIAGNOSIS — F4310 Post-traumatic stress disorder, unspecified: Secondary | ICD-10-CM

## 2014-09-07 LAB — GLUCOSE, CAPILLARY
GLUCOSE-CAPILLARY: 142 mg/dL — AB (ref 70–99)
Glucose-Capillary: 117 mg/dL — ABNORMAL HIGH (ref 70–99)
Glucose-Capillary: 138 mg/dL — ABNORMAL HIGH (ref 70–99)
Glucose-Capillary: 94 mg/dL (ref 70–99)

## 2014-09-07 MED ORDER — MELOXICAM 7.5 MG PO TABS
7.5000 mg | ORAL_TABLET | Freq: Every day | ORAL | Status: DC
  Administered 2014-09-08 – 2014-09-09 (×2): 7.5 mg via ORAL
  Filled 2014-09-07 (×4): qty 1
  Filled 2014-09-07: qty 14

## 2014-09-07 MED ORDER — PANTOPRAZOLE SODIUM 40 MG PO TBEC
40.0000 mg | DELAYED_RELEASE_TABLET | Freq: Every day | ORAL | Status: DC
  Administered 2014-09-07: 20 mg via ORAL
  Administered 2014-09-08 – 2014-09-09 (×2): 40 mg via ORAL
  Filled 2014-09-07: qty 1
  Filled 2014-09-07: qty 14
  Filled 2014-09-07 (×3): qty 1

## 2014-09-07 MED ORDER — MELOXICAM 7.5 MG PO TABS
ORAL_TABLET | ORAL | Status: AC
  Administered 2014-09-07: 7.5 mg
  Filled 2014-09-07: qty 1

## 2014-09-07 MED ORDER — PANTOPRAZOLE SODIUM 20 MG PO TBEC
DELAYED_RELEASE_TABLET | ORAL | Status: AC
  Administered 2014-09-07: 20 mg via ORAL
  Filled 2014-09-07: qty 1

## 2014-09-07 NOTE — [facility] Group Notes (Signed)
[facility] Group Notes:  (Clinical Social Work)  09/07/2014     10-11AM  Summary of Progress/Problems:   The main focus of today's process group was to learn how to use a decisional balance exercise to move forward in the Stages of Change, which were described and discussed.  Motivational Interviewing and a worksheet were utilized to help patients explore in depth the perceived benefits and costs of a self-sabotaging behavior, as well as the  benefits and costs of replacing that with a healthy coping mechanism.   The patient expressed that he uses the unhealthy coping skill of directing anger at both himself and others; however, he also felt he uses isolation as one of his main unhealthy coping skills, and participated heavily in the exercise utilizing isolation as the example.  At times he would refocus on himself and repeat his story, and other patients appeared to have heard this before, were a little impatient with him.  Overall participated well.  Type of Therapy:  Group Therapy - Process   Participation Level:  Active  Participation Quality:  Monopolizing and Redirectable  Affect:  Flat  Cognitive:  Appropriate and Oriented  Insight:  Developing/Improving  Engagement in Therapy:  Engaged  Modes of Intervention:  Education, Motivational Interviewing  Ambrose MantleMareida Grossman-Orr, LCSW 09/07/2014, 12:47 PM

## 2014-09-07 NOTE — Progress Notes (Signed)
Psychoeducational Group Note  Date: 09/07/2014 Time: 1315  Group Topic/Focus:  Identifying Needs:   The focus of this group is to help patients identify their personal needs that have been historically problematic and identify healthy behaviors to address their needs.  Participation Level:  Active  Participation Quality:  Attentive  Affect:  Depressed  Cognitive:  Alert  Insight:  Engaged  Engagement in Group:  Engaged  Additional Comments:    3/19/20165:18 PM Espyn Radwan, Joie BimlerPatricia Lynn

## 2014-09-07 NOTE — Progress Notes (Signed)
D: When asked the circumstances surrounding his adm pt stated, "harming myself, depression and the voices". Stated. "I'm getting better, more relaxed now. Not as anxious as I was". Pt informed the writer that he plans to be discharged on Monday, back to his dad's house. [facility]   A:  Support and encouragement was offered. 15 min checks continued for safety.  R: Pt remains safe.

## 2014-09-07 NOTE — Progress Notes (Signed)
Patient did attend the evening speaker AA meeting.  

## 2014-09-07 NOTE — Psychosocial Assessment (Signed)
Alex Turner cont to get stronger. HE takes his scheduled meds as ordered and he attends his groups as as planned.   A [name] completes his self inventory and on it he writes he denies SI, and he rates hsi depressionn, hopelessness and anxiety " 2/0/4", respectively.   R POC cont.
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
## 2014-09-08 LAB — GLUCOSE, CAPILLARY
GLUCOSE-CAPILLARY: 103 mg/dL — AB (ref 70–99)
GLUCOSE-CAPILLARY: 119 mg/dL — AB (ref 70–99)
Glucose-Capillary: 106 mg/dL — ABNORMAL HIGH (ref 70–99)
Glucose-Capillary: 97 mg/dL (ref 70–99)

## 2014-09-08 NOTE — Progress Notes (Signed)
D   D avid has had the best day today so far. HE is acclamated  To the milieu now. HE interacts easily and appropriately with his  Peers and the staff.. HE takes his  meds as ordered and he attends his groups.   A He completed his assessment this morning and on it he wrote he deneid SI and he rated his depression, hopelessness and anxiety " 2/0/2" respectively. He is planning on DC back home tomorrow and he has spoken to his father today about this and they are made aware of his DC.   R He is open in his Life SKills group  Today about learning to be healthier and is trying to establish healthier relationships. Safety in place.
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
## 2014-09-08 NOTE — Progress Notes (Signed)
Psychoeducational Group Note  Date:  09/08/2014 Time: 1015 Group Topic/Focus:  Making Healthy Choices:   The focus of this group is to help patients identify negative/unhealthy choices they were using prior to admission and identify positive/healthier coping strategies to replace them upon discharge.  Participation Level:  Active  Participation Quality:  Appropriate  Affect:  Appropriate  Cognitive:  Alert  Insight:  Engaged  Engagement in Group:  Engaged  Additional Comments:    Alex Turner, Alex Turner 3:33 PM. 09/08/2014

## 2014-09-08 NOTE — Progress Notes (Signed)
Psychoeducational Group Note  Date: 09/08/2014 Time: 1015  Group Topic/Focus:  Making Healthy Choices:   The focus of this group is to help patients identify negative/unhealthy choices they were using prior to admission and identify positive/healthier coping strategies to replace them upon discharge.  Participation Level:  Active  Participation Quality:  Appropriate  Affect:  Anxious  Cognitive:  Appropriate  Insight:  Engaged  Engagement in Group:    Additional Comments:  Pt shared 3 things that ground him are loyalty, his grandmother and ( he couldn't identify a 3rd). He said he has learned that ' no matter how bad things get...they can always get worse.".  09/08/2014,11:53 AM [name], Joie BimlerPatricia Lynn

## 2014-09-08 NOTE — [facility] Group Notes (Signed)
[facility] Group Notes:  (Clinical Social Work)  09/08/2014   10:00am-11:00am  Summary of Progress/Problems:  The main focus of today's process group was to discuss adding healthy supports to enhance recovery.  The need for support and types that are available was explored in the first half of group.  For the second half, patients listened to various genres of music and identified their emotional responses.  Handouts were used to record feelings evoked, as well as how patient can personally use this knowledge in sleep habits, with depression, and with other symptoms.  The patient expressed understanding of concepts, as well as knowledge of how each type of music affected him/her and how this can be used at home as a wellness/recovery tool.  He was unable to tolerate some of the music requested by others, and left the room but returned afterward.  Type of Therapy:  Music Therapy   Participation Level:  Active  Participation Quality:  Attentive and Sharing, Monopolizing but Redirectable  Affect:  Blunted  Cognitive:  Oriented  Insight:  Engaged  Engagement in Therapy:  Engaged  Modes of Intervention:   Activity, Exploration  Ambrose MantleMareida Grossman-Orr, LCSW 09/08/2014, 12:30pm

## 2014-09-08 NOTE — Progress Notes (Signed)
Patient did attend the evening speaker AA meeting.  

## 2014-09-09 LAB — GLUCOSE, CAPILLARY
Glucose-Capillary: 111 mg/dL — ABNORMAL HIGH (ref 70–99)
Glucose-Capillary: 93 mg/dL (ref 70–99)

## 2014-09-09 MED ORDER — GABAPENTIN 100 MG PO CAPS: 100.0000 mg | ORAL_CAPSULE | ORAL | Status: DC

## 2014-09-09 MED ORDER — SIMVASTATIN 20 MG PO TABS: 20.0000 mg | ORAL_TABLET | Freq: Every day | ORAL | Status: DC

## 2014-09-09 MED ORDER — HYDROXYZINE HCL 25 MG PO TABS
25.0000 mg | ORAL_TABLET | Freq: Four times a day (QID) | ORAL | Status: DC | PRN
Start: 2014-09-09 — End: 2023-07-18

## 2014-09-09 MED ORDER — OMEPRAZOLE 20 MG PO CPDR: 20.0000 mg | DELAYED_RELEASE_CAPSULE | Freq: Two times a day (BID) | ORAL | Status: DC

## 2014-09-09 MED ORDER — FLUPHENAZINE HCL 2.5 MG PO TABS: 7.5000 mg | ORAL_TABLET | ORAL | Status: DC

## 2014-09-09 MED ORDER — OXCARBAZEPINE 150 MG PO TABS: 150.0000 mg | ORAL_TABLET | Freq: Two times a day (BID) | ORAL | Status: DC

## 2014-09-09 MED ORDER — BENZTROPINE MESYLATE 1 MG PO TABS: 1.0000 mg | ORAL_TABLET | ORAL | Status: DC

## 2014-09-09 MED ORDER — ASPIRIN EC 81 MG PO TBEC: 81.0000 mg | DELAYED_RELEASE_TABLET | Freq: Every day | ORAL | Status: DC

## 2014-09-09 MED ORDER — ACETAMINOPHEN 500 MG PO TABS: 1000.0000 mg | ORAL_TABLET | Freq: Four times a day (QID) | ORAL | Status: DC | PRN

## 2014-09-09 MED ORDER — TRAZODONE HCL 100 MG PO TABS: 100.0000 mg | ORAL_TABLET | Freq: Every day | ORAL | Status: DC

## 2014-09-09 MED ORDER — MELOXICAM 15 MG PO TABS: ORAL_TABLET | ORAL | Status: DC

## 2014-09-09 MED ORDER — FLUPHENAZINE HCL 2.5 MG PO TABS
7.5000 mg | ORAL_TABLET | ORAL | Status: DC
  Filled 2014-09-09: qty 84

## 2014-09-09 NOTE — [facility] Suicide Risk Assessment (Signed)
[facility] Discharge Suicide Risk Assessment   Demographic Factors:  Caucasian  Total Time spent with patient: 30 minutes  Musculoskeletal: Strength & Muscle Tone: decreased Gait & Station: unsteady Patient leans: N/A  Psychiatric Specialty Exam: Physical Exam  ROS  Blood pressure 112/79, pulse 91, temperature 98.5 F (36.9 C), temperature source Oral, resp. rate 16, height 5' 5.5" (1.664 m), weight 89.018 kg (196 lb 4 oz).Body mass index is 32.15 kg/(m^2).  General Appearance: Casual and Fairly Groomed  Patent attorney::  Fair  Speech:  Normal Rate409  Volume:  Normal  Mood:  Euthymic  Affect:  Appropriate, Congruent and Full Range  Thought Process:  Coherent and Goal Directed  Orientation:  Full (Time, Place, and Person)  Thought Content:  Negative  Suicidal Thoughts:  No  Homicidal Thoughts:  No  Memory:  Negative  Judgement:  Fair  Insight:  Fair  Psychomotor Activity:  Normal  Concentration:  Fair  Recall:  Fair  Fund of Knowledge:Fair  Language: Fair  Akathisia:  Negative  Handed:  Right  AIMS (if indicated):     Assets:  Communication Skills Desire for Improvement Housing Social Support  Sleep:  Number of Hours: 5.25  Cognition: WNL  ADL's:  Intact   Have you used any form of tobacco in the last 30 days? (Cigarettes, Smokeless Tobacco, Cigars, and/or Pipes): Yes  Has this patient used any form of tobacco in the last 30 days? (Cigarettes, Smokeless Tobacco, Cigars, and/or Pipes) Yes, A prescription for an FDA-approved tobacco cessation medication was offered at discharge and the patient refused  Mental Status Per Nursing Assessment::   On Admission:  Plan includes specific time, place, or method, Self-harm thoughts, Intention to act on plan to harm others  Current Mental Status by Physician: Pt reports feeling better. Mood is good. Sleep/appetite good. Pt denies SI/HI/AVH. Although pt reported hearing voices yesterday, he could not understand what they were saying  (and he ignored them). He believes he may have heard the voices yesterday because he was bored and slightly anxious. Tolerating meds well. Pt reports feeling good today, and reports readiness for discharge. Pt will be living with his father, and will be following up with [facility] [place].   Loss Factors: NA  Historical Factors: Prior suicide attempts  Risk Reduction Factors:   Sense of responsibility to family, Living with another person, especially a relative, Positive social support, Positive therapeutic relationship and Positive coping skills or problem solving skills  Continued Clinical Symptoms:  Schizophrenia:   Depressive state  Cognitive Features That Contribute To Risk:  Loss of executive function    Suicide Risk:  Minimal: No identifiable suicidal ideation.  Patients presenting with no risk factors but with morbid ruminations; may be classified as minimal risk based on the severity of the depressive symptoms  Principal Problem: Schizoaffective disorder, bipolar type Discharge Diagnoses:  Patient Active Problem List   Diagnosis Date Noted  . Hyperlipidemia [E78.5] 09/05/2014  . Schizoaffective disorder, bipolar type [F25.0] 09/04/2014  . PTSD (post-traumatic stress disorder) [F43.10] 09/04/2014  . Borderline personality disorder [F60.3] 09/04/2014  . Cannabis use disorder, severe, in early remission [F12.90] 09/04/2014  . Slurred speech [R47.81]   . Faintness [R55]   . Syncope [R55] 05/01/2014  . Diabetes mellitus [E11.9] 05/01/2014  . TIA (transient ischemic attack) [G45.9] 05/01/2014  . Left-sided weakness [M62.89]     Follow-up Information    [facility] with [facility] [place].   Why:  Message left on 09/06/14  requesting appt for med management and therapy. Office will call you directly with appt dates/times. If you do not get call by Tues 3/22, please call office to schedule appts for follow-up. Thanks!   Contact information:   [address] [place], [place] [address] Phone: [phone] Fax: [phone]      Plan Of Care/Follow-up recommendations:  Activity:  as tolerated (uses a cane) Diet:  low cholesterol Tests:  per PCP Other[facility]  [facility]  Is patient on multiple antipsychotic therapies at discharge:  No   Has Patient had three or more failed trials of antipsychotic monotherapy by history:  No  Recommended Plan for Multiple Antipsychotic Therapies: NA    Alex Turner 09/09/2014, 10:29 AM

## 2014-09-09 NOTE — Progress Notes (Signed)
Recreation Therapy Notes  Date: 03.21.2016 Time: 9:30am Location: [address] Group Room   Group Topic: Stress Management  Goal Area(s) Addresses:  Patient will actively participate in stress management techniques presented during session.   Behavioral Response: Did not attend.   Marykay Lexenise L Harue Pribble, LRT/CTRS  Lu Paradise L 09/09/2014 10:10 AM

## 2014-09-09 NOTE — Progress Notes (Signed)
D. Pt had been up and visible in milieu this evening, did attend and participate in evening group activity. Pt spoke about how he is feeling better than when he came in, reports sleeping better and denied any auditory hallucinations this evening and spoke about how he will be discharged in the morning. A. Support and encouragement provided. R. Safety maintained, will continue to monitor.

## 2014-09-09 NOTE — Progress Notes (Signed)
Discharge Note: Discharge instructions/prescriptions/medication samples given to patient. Patient verbalized understanding of discharge instructions and prescriptions. Returned belongings to patient. Denies SI/HI/AVH. Patient d/c without incident to the lobby and transported home with his father.

## 2014-09-09 NOTE — Progress Notes (Signed)
  [facility] :  Will you be returning to the same living situation after discharge:  Yes,  Patient plans to return home with his father At discharge, do you have transportation home?: Yes,  patient plans to take bus home, bus pass placed on patient's chart Do you have the ability to pay for your medications: Yes,  patient will be provided with samples and prescriptions at discharge.  Release of information consent forms completed and in the chart;  Patient's signature needed at discharge.  Patient to Follow up at: Follow-up Information    [facility]   Why:  Message left on 09/06/14 requesting appt for med management and therapy. Office will call you directly with appt dates/times. If you do not get call by Tues 3/22, please call office to schedule appts for follow-up. Thanks!   Contact information:   [address] [place], [place] [address] Phone: [phone] Fax: [phone]      Patient denies SI/HI: Yes,  denies    Safety Planning and Suicide Prevention discussed: Yes,  with patient and father  Have you used any form of tobacco in the last 30 days? (Cigarettes, Smokeless Tobacco, Cigars, and/or Pipes): Yes  Has patient been referred to the Quitline?: Yes, faxed on 09/09/14  Naethan Bracewell, West CarboKristin L 09/09/2014, 9:52 AM

## 2014-09-09 NOTE — [facility] Group Notes (Signed)
   [facility]  09/09/2014  8:45 AM   Participation Quality: Alert, Appropriate and Oriented  Mood/Affect: Appropriate  Depression Rating: 2  Anxiety Rating: 2  Thoughts of Suicide: Pt denies SI/HI  Will you contract for safety? Yes  Current AVH: Pt denies  Plan for Discharge/Comments: Pt attended discharge planning group and actively participated in group. CSW provided pt with today's workbook. Patient reports feeling ready to return home with his father today. [facility] and is agreeable to contacting them at discharge to schedule follow up appointment. Patient provided with listing of low income housing and bus pass at his request.  Transportation Means: Pt reports access to transportation- bus pass placed on patient's chart.  Supports: No supports mentioned at this time  Samuella BruinKristin Ramar Nobrega, MSW, [employer]CSWA Clinical Social Worker [facility] [phone]
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
## 2014-09-11 NOTE — Progress Notes (Signed)
Patient Discharge Instructions:  After Visit Summary (AVS):   Faxed to:  09/11/14 Discharge Summary Note:   Faxed to:  09/11/14 Psychiatric Admission Assessment Note:   Faxed to:  09/11/14 Suicide Risk Assessment - Discharge Assessment:   Faxed to:  09/11/14 Faxed/Sent to the Next Level Care provider:  09/11/14 [facility] @ [phone]  Jerelene ReddenSheena E Pasatiempo, 09/11/2014, 3:59 PM

## 2015-11-13 IMAGING — CR DG KNEE COMPLETE 4+V*L*
4 series · 4 of 4 positions shown · non-contrast
Comparison: None

CLINICAL DATA: Left anterior knee pain

EXAM:
LEFT KNEE - COMPLETE 4+ VIEW

[x knee ap left (1 of 4)]
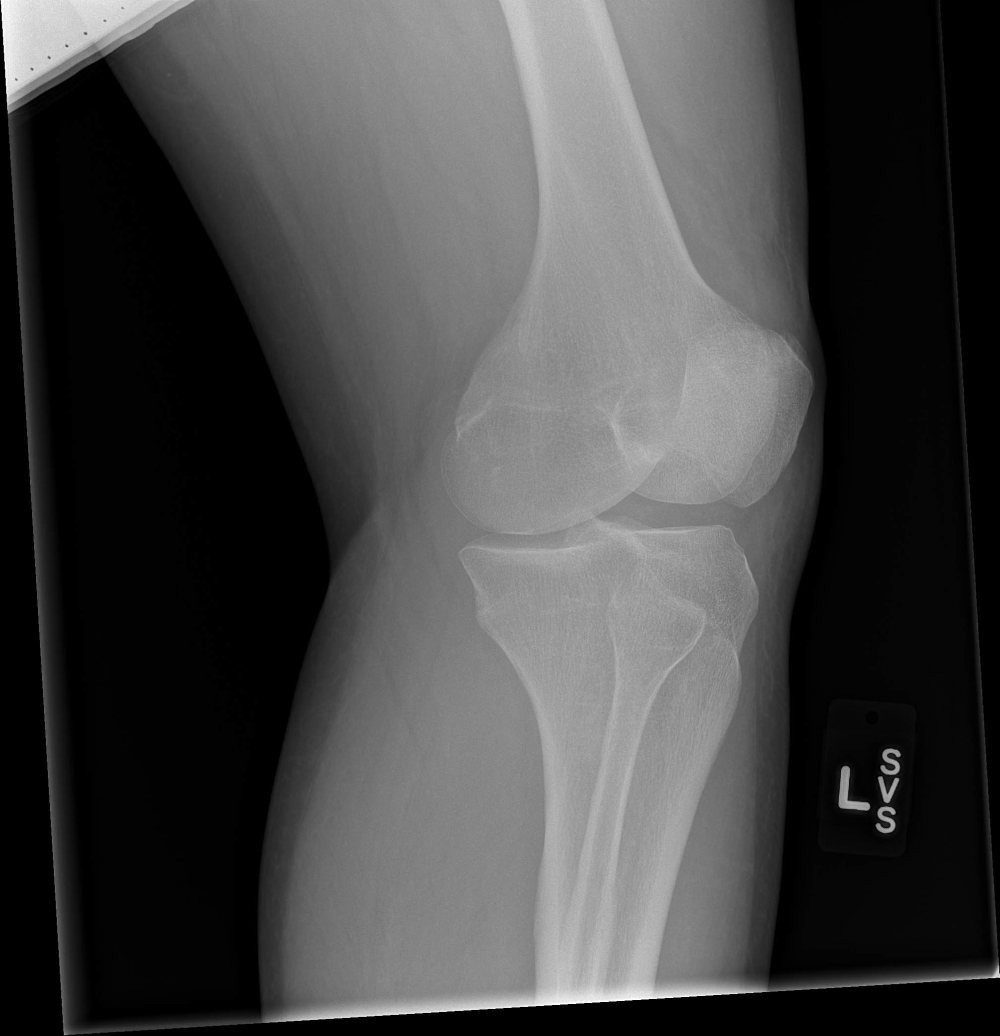

[x knee ap left (2 of 4)]
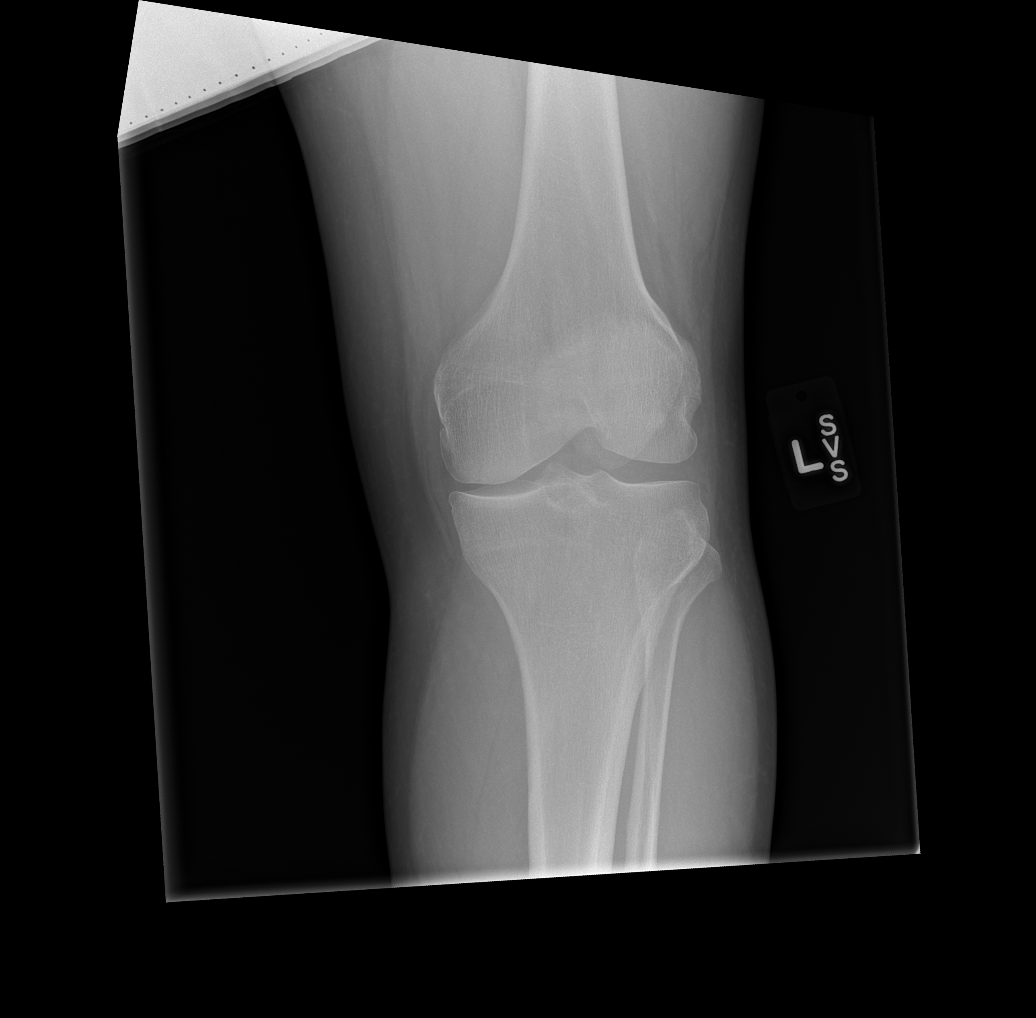

[x knee ap left (3 of 4)]
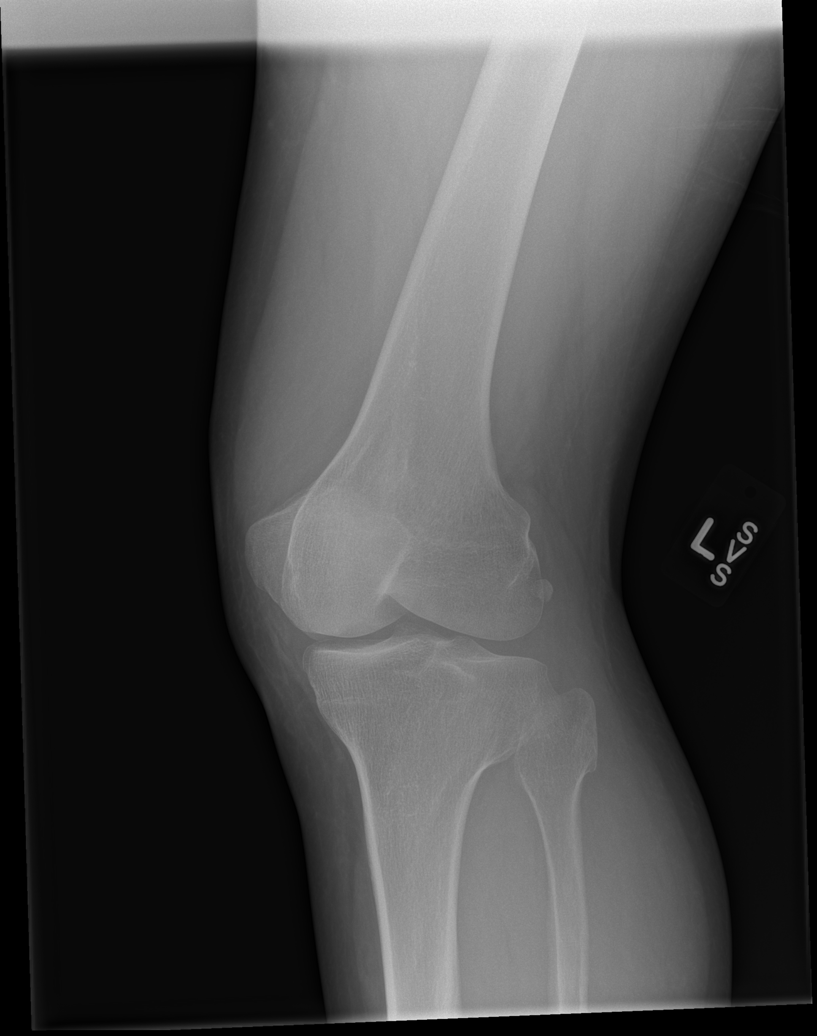

[x knee ap left (4 of 4)]
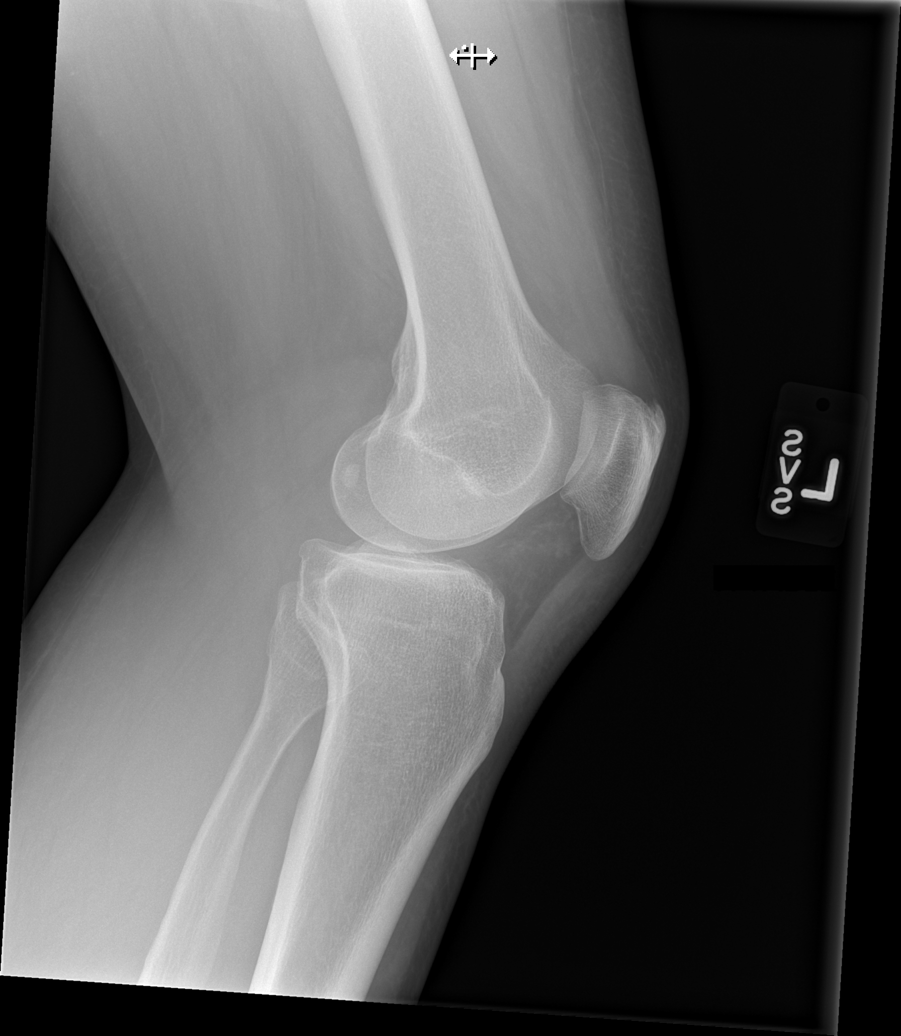

[4 of 4 positions shown; findings below may reference images not displayed]

FINDINGS: No fracture of the proximal tibia or distal femur. Patella is
normal. No joint effusion.
IMPRESSION: No acute osseous abnormality.

## 2016-03-21 IMAGING — CT CT HEAD W/O CM
1 of 2 series · 15 of 30 positions shown, 19 images · non-contrast
Comparison: There is no evidence of mass effect, midline shift or
extra-axial fluid collections.

CLINICAL DATA: Code stroke, left-sided numbness

EXAM:
CT HEAD WITHOUT CONTRAST
TECHNIQUE: Contiguous axial images were obtained from the base of the skull
through the vertex without intravenous contrast.

[Series 3: head 2.0 h70h · axial · 0.46mm/px · z∈[-151,-13]mm · 15 of 77 slices shown, 19 images]
[im 4/77  brain]
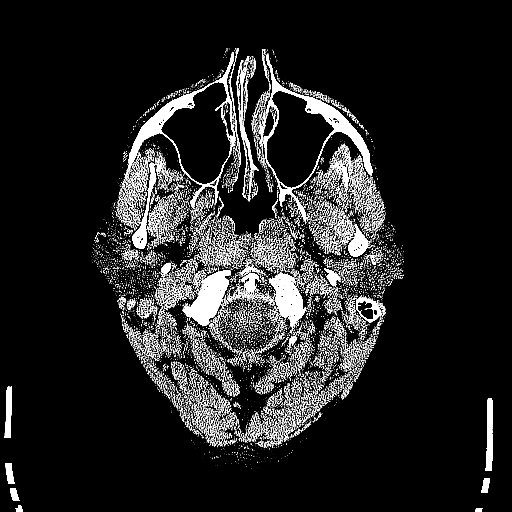
[im 4/77  bone]
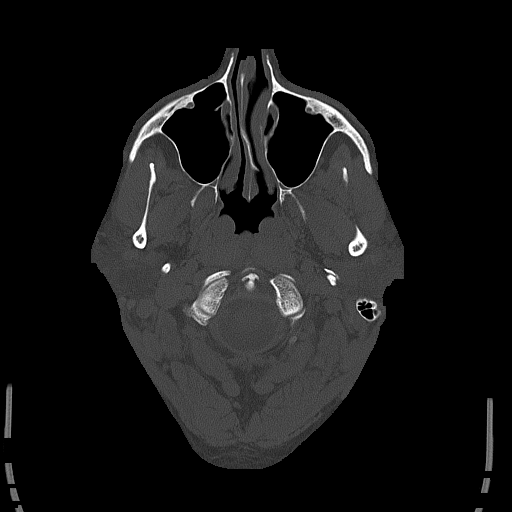
[im 8/77  brain]
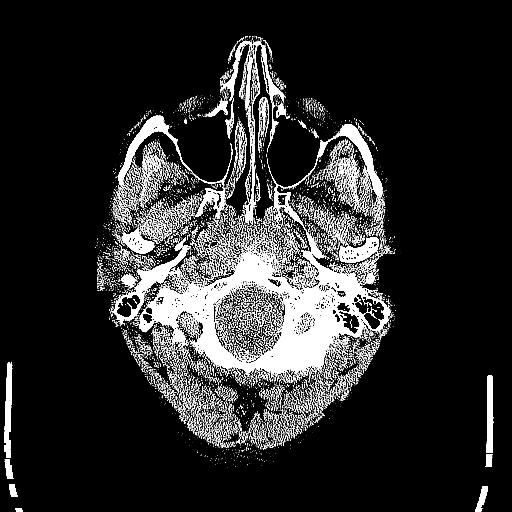
[im 16/77  brain]
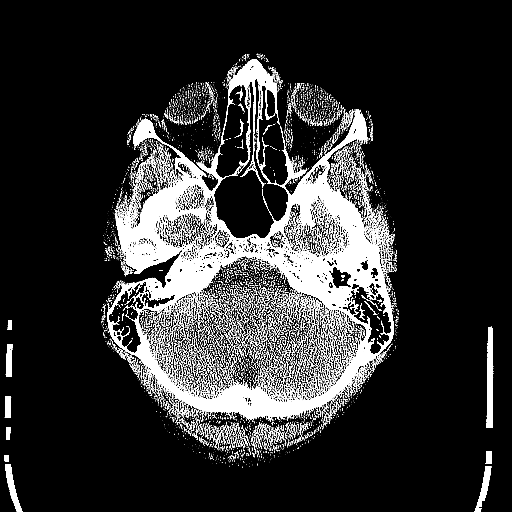
[im 20/77  brain]
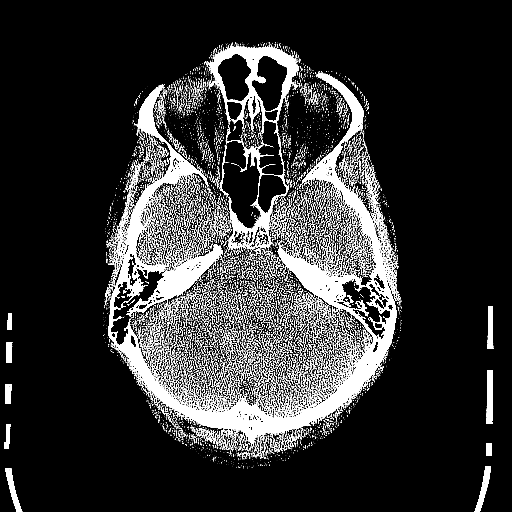
[im 23/77  brain]
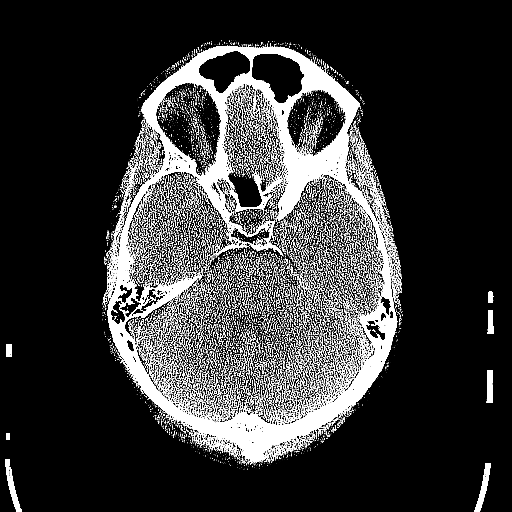
[im 23/77  bone]
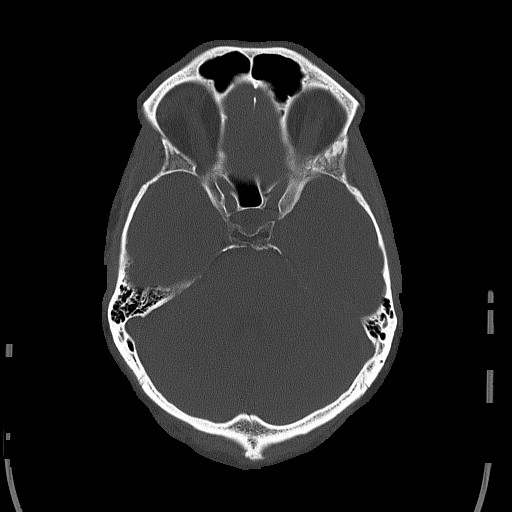
[im 27/77  brain]
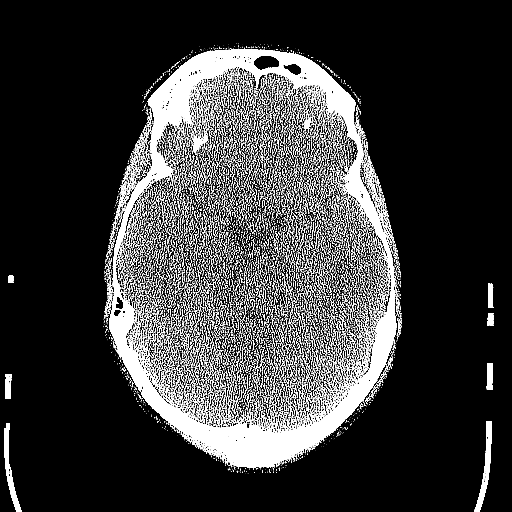
[im 35/77  brain]
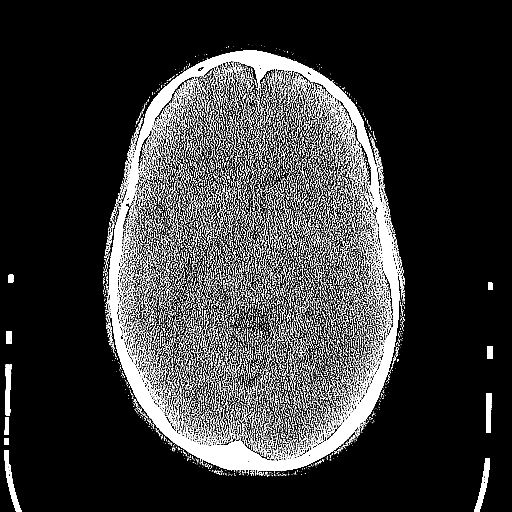
[im 39/77  brain]
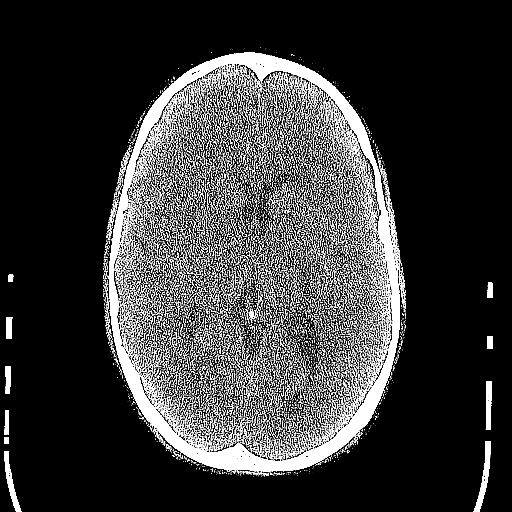
[im 42/77  brain]
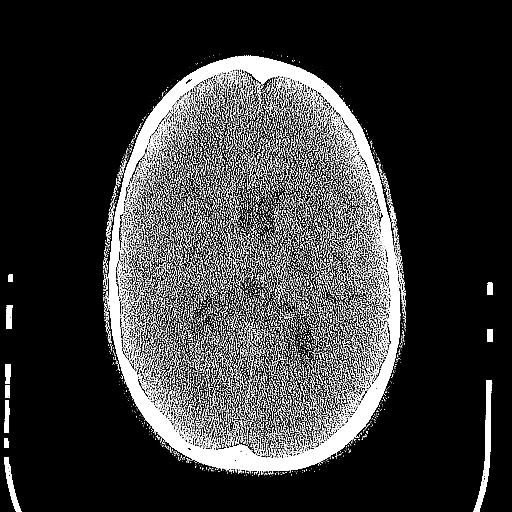
[im 42/77  bone]
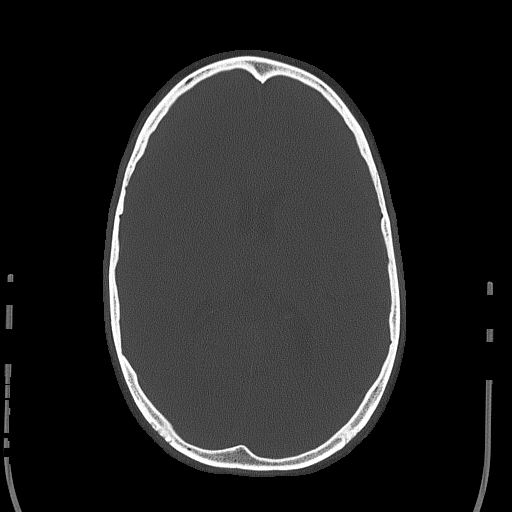
[im 50/77  brain]
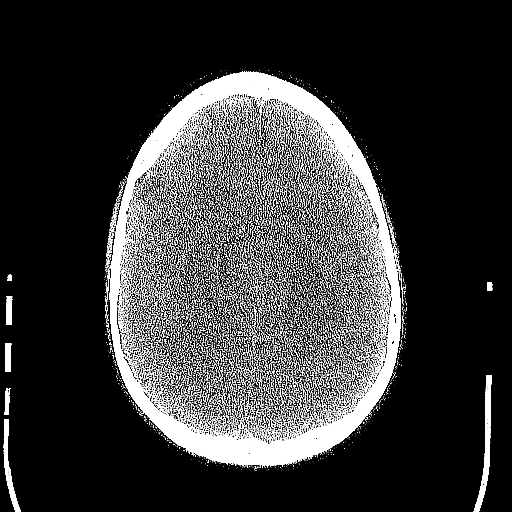
[im 54/77  brain]
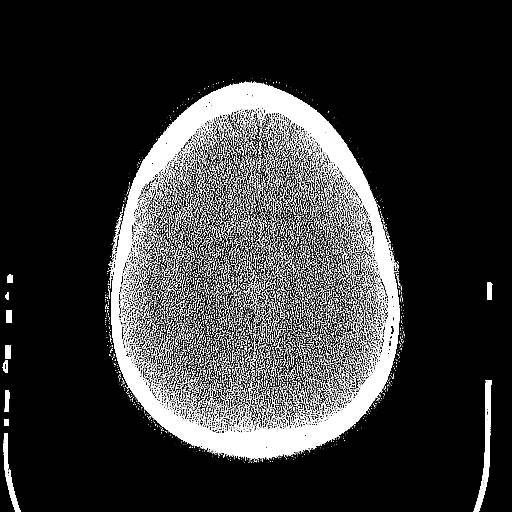
[im 58/77  brain]
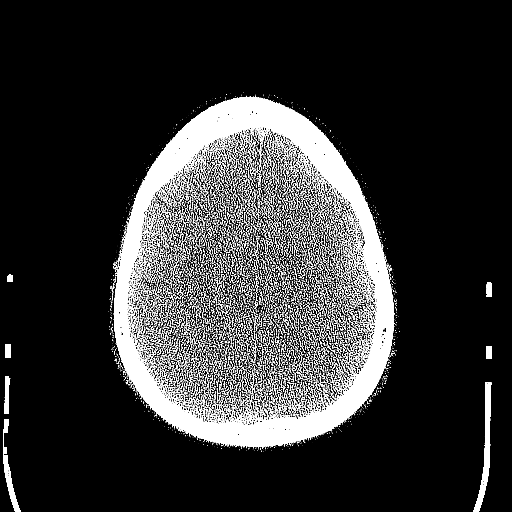
[im 61/77  brain]
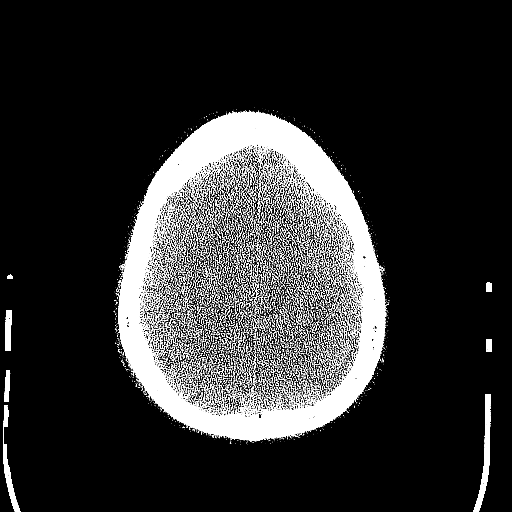
[im 61/77  bone]
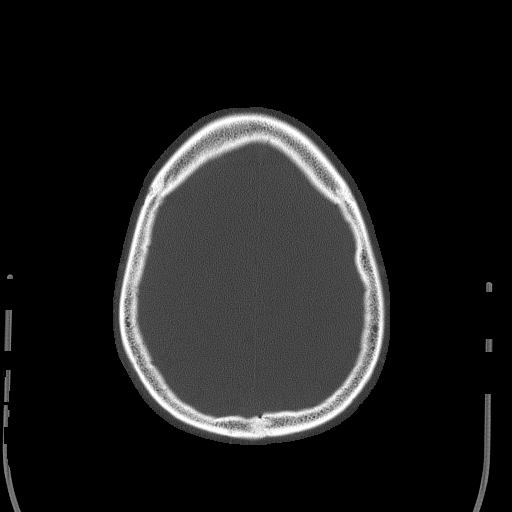
[im 69/77  brain]
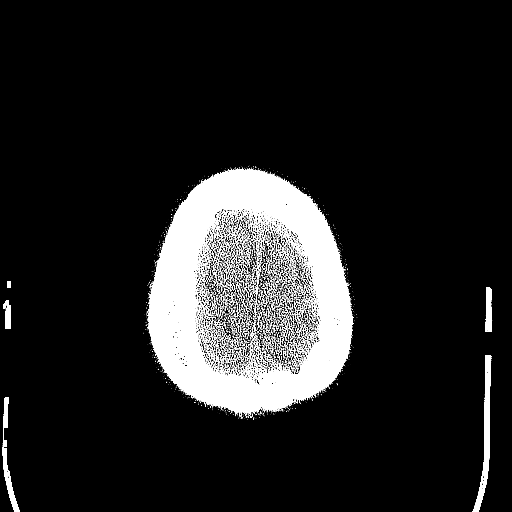
[im 73/77  brain]
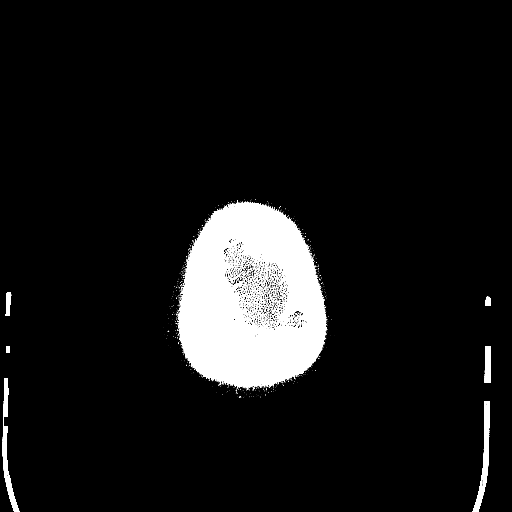

[15 of 30 positions shown; findings below may reference images not displayed]

There is no evidence of a
space-occupying lesion or intracranial hemorrhage. There is no
evidence of a cortical-based area of acute infarction.

The ventricles and sulci are appropriate for the patient's age. The
basal cisterns are patent.

Visualized portions of the orbits are unremarkable. The visualized
portions of the paranasal sinuses and mastoid air cells are
unremarkable.

The osseous structures are unremarkable.
FINDINGS: Normal CT of the brain without intravenous contrast.

These results were called by telephone at the time of interpretation
on 05/08/2014 at [DATE] to Dr. COBLE, who verbally
acknowledged these results.

## 2021-11-27 ENCOUNTER — Other Ambulatory Visit: Payer: Self-pay

## 2021-11-27 ENCOUNTER — Emergency Department (HOSPITAL_COMMUNITY)
Admission: EM | Admit: 2021-11-27 | Discharge: 2021-11-27 | Disposition: A | Discharge status: Home / Self Care | Payer: No Typology Code available for payment source | Attending: Emergency Medicine | Admitting: Emergency Medicine

## 2021-11-27 ENCOUNTER — Emergency Department (HOSPITAL_COMMUNITY): Payer: No Typology Code available for payment source

## 2021-11-27 DIAGNOSIS — Z7982 Long term (current) use of aspirin: Secondary | ICD-10-CM | POA: Insufficient documentation

## 2021-11-27 DIAGNOSIS — M7021 Olecranon bursitis, right elbow: Secondary | ICD-10-CM | POA: Insufficient documentation

## 2021-11-27 DIAGNOSIS — Y939 Activity, unspecified: Secondary | ICD-10-CM | POA: Insufficient documentation

## 2021-11-27 NOTE — ED Triage Notes (Signed)
Pt. Stated, I have a cyst on my rt. Arm for 2 weeks and I have one that's been there for a year.

## 2021-11-27 NOTE — ED Provider Triage Note (Signed)
Emergency Medicine Provider Triage Evaluation Note  Alex Turner , a 51 y.o. male  was evaluated in triage.  Pt complains of right elbow growth. He reports one of them is been there for the past 5 to 10 years but they have been more anteriorly has been there for the past 2 to 3 weeks and he reports its bothering him.  Slightly painful.  Denies any IV drug use or any trauma to the area.  Denies any fevers.  Full range of motion.  Review of Systems  Positive:  Negative:   Physical Exam  BP (!) 147/86 (BP Location: Left Arm)   Pulse 63   Temp 98.1 F (36.7 C) (Oral)   Resp 17   Ht 5\' 6"  (1.676 m)   Wt 69.4 kg   SpO2 96%   BMI 24.69 kg/m  Gen:   Awake, no distress   Resp:  Normal effort  MSK:   Moves extremities without difficulty  Other:  Range of motion of right elbow.  Palpable pulses.  Compartment soft.  Medical Decision Making  Medically screening exam initiated at 9:52 AM.  Appropriate orders placed.  Clydene Pugh was informed that the remainder of the evaluation will be completed by another provider, this initial triage assessment does not replace that evaluation, and the importance of remaining in the ED until their evaluation is complete.  We will order x-rays and basic labs to rule out infection.  Based on physical exam I think the most superior 1 is a cyst with the lower being a lipoma.  Doubt an infection at this time as there is no warmth.   Sherrell Puller, [place] 11/27/21 [phone]

## 2021-11-27 NOTE — ED Provider Notes (Signed)
[facility] EMERGENCY DEPARTMENT Provider Note   CSN: [id number] Arrival date & time: 11/27/21  2423     History  Chief Complaint  Patient presents with   Cyst    Alex Turner is a 51 y.o. male.  51 year old male presents with complaint of swelling in his right elbow.  States that he has had 1 area which has been present for the past 10 years or so however has a second area which appeared 2 to 3 weeks ago.  He denies any pain in the area says that they are just irritating if he were to try to rest his elbows down.  He denies any trauma to his elbow.  Has not tried to drain or squeeze the areas.  No other complaints or concerns.       Home Medications Prior to Admission medications   Medication Sig Start Date End Date Taking? Authorizing Provider  acetaminophen (TYLENOL) 500 MG tablet Take 2 tablets (1,000 mg total) by mouth every 6 (six) hours as needed for moderate pain or headache. 09/09/14   Armandina Stammer I, NP  aspirin EC 81 MG tablet Take 1 tablet (81 mg total) by mouth daily. For heart health 09/09/14   Armandina Stammer I, NP  benztropine (COGENTIN) 1 MG tablet Take 1 tablet (1 mg total) by mouth 2 (two) times daily in the am and at bedtime.. For prevention of drug induced tremors 09/09/14   Armandina Stammer I, NP  fluPHENAZine (PROLIXIN) 2.5 MG tablet Take 3 tablets (7.5 mg total) by mouth 2 (two) times daily in the am and at bedtime.. For mood control 09/09/14   Armandina Stammer I, NP  gabapentin (NEURONTIN) 100 MG capsule Take 1 capsule (100 mg total) by mouth 3 (three) times daily at 8am, 3pm and bedtime. For agitation 09/09/14   Armandina Stammer I, NP  hydrOXYzine (ATARAX/VISTARIL) 25 MG tablet Take 1 tablet (25 mg total) by mouth every 6 (six) hours as needed for anxiety. 09/09/14   Armandina Stammer I, NP  meloxicam (MOBIC) 15 MG tablet Take 1/2 (half) at tablet daily: For arthritic pain 09/09/14   Armandina Stammer I, NP  omeprazole (PRILOSEC) 20 MG capsule Take 1 capsule (20 mg  total) by mouth 2 (two) times daily before a meal. For acid reflux 09/09/14   Armandina Stammer I, NP  OXcarbazepine (TRILEPTAL) 150 MG tablet Take 1 tablet (150 mg total) by mouth 2 (two) times daily. For mood stabilization 09/09/14   Armandina Stammer I, NP  simvastatin (ZOCOR) 20 MG tablet Take 1 tablet (20 mg total) by mouth daily at 6 PM. For high cholesterol 09/09/14   Armandina Stammer I, NP  traZODone (DESYREL) 100 MG tablet Take 1 tablet (100 mg total) by mouth at bedtime. For sleep 09/09/14   Armandina Stammer I, NP      Allergies    Bee venom, Tape, Penicillins, and Wellbutrin [bupropion hcl]    Review of Systems   Review of Systems Negative except as per HPI Physical Exam Updated Vital Signs BP (!) 147/86 (BP Location: Left Arm)   Pulse 63   Temp 98.1 F (36.7 C) (Oral)   Resp 17   Ht 5\' 6"  (1.676 m)   Wt 69.4 kg   SpO2 96%   BMI 24.69 kg/m  Physical Exam Vitals and nursing note reviewed.  Constitutional:      General: He is not in acute distress.    Appearance: He is well-developed. He is not diaphoretic.  HENT:  Head: Normocephalic and atraumatic.  Cardiovascular:     Pulses: Normal pulses.  Pulmonary:     Effort: Pulmonary effort is normal.  Musculoskeletal:        General: No tenderness, deformity or signs of injury. Normal range of motion.     Comments: There are 2 golf ball sized soft, rubbery mobile masses noted to the right elbow along the olecranon area.  There is no overlying erythema, no drainage, no streaking.  Normal range of motion of elbow noted, sensation intact, strong radial pulse present.  Skin:    General: Skin is warm and dry.     Findings: No erythema or rash.  Neurological:     Mental Status: He is alert and oriented to person, place, and time.  Psychiatric:        Behavior: Behavior normal.     ED Results / Procedures / Treatments   Labs (all labs ordered are listed, but only abnormal results are displayed) Labs Reviewed - No data to  display  EKG None  Radiology DG Elbow Complete Right  Result Date: 11/27/2021 CLINICAL DATA:  Enlarging cystic lesions. EXAM: RIGHT ELBOW - COMPLETE 3+ VIEW COMPARISON:  None Available. FINDINGS: No bony abnormality observed.  No discrete elbow joint effusion. Prominent density in the posterior subcutaneous tissues dorsal to the distal humerus and elbow, possibilities may include ganglion cyst or substantial bilobed olecranon bursitis. IMPRESSION: 1. Prominent density in the posterior subcutaneous tissues along the elbow, possibilities may include ganglion cyst or bilobed olecranon bursitis. No underlying bony abnormality. Electronically Signed   By: Gaylyn Rong M.D.   On: 11/27/2021 10:10    Procedures Procedures    Medications Ordered in ED Medications - No data to display  ED Course/ Medical Decision Making/ A&P                           Medical Decision Making  51 year old male with complaint of swelling along his right elbow area. Likely bursa sac, referred to ortho for management. XR without acute bony abnormality.         Final Clinical Impression(s) / ED Diagnoses Final diagnoses:  Olecranon bursitis of right elbow    Rx / DC Orders ED Discharge Orders     None         Jeannie Fend, PA-C 11/27/21 1146    Jacalyn Lefevre, MD 11/30/21 1605

## 2021-12-02 ENCOUNTER — Other Ambulatory Visit: Payer: Self-pay

## 2021-12-02 ENCOUNTER — Other Ambulatory Visit: Payer: Self-pay | Admitting: Orthopedic Surgery

## 2021-12-02 ENCOUNTER — Encounter (HOSPITAL_BASED_OUTPATIENT_CLINIC_OR_DEPARTMENT_OTHER): Payer: Self-pay | Admitting: Orthopedic Surgery

## 2021-12-03 NOTE — H&P (Signed)
Primary Care Provider: Not noted Referring Provider: ED Worker's Comp: No Date of Injury or Onset: Chronic and acute  History: CC / Reason for Visit: Right elbow problem HPI: This patient is a 51 year old RHD male with history of bipolar disease who presents for evaluation of his right elbow.  He is accompanied by his mother's significant other.  He was recently in emergency department with posterior right elbow mass Dr. Yevette Edwards, who was on call.  He presents today for evaluation.  He reports that the larger one more proximal has been there for maybe 10 years.  He has adapted to it but is still bothersome when he places his elbow onto a surface especially.  The more distal mass, forming almost a dumbbell configuration, was more recent in development and more rapid development, perhaps only a couple weeks.  Past medical history, past surgical history, family history, social history, medications, allergies and review of systems are thoroughly reviewed by me, signed and scanned into SRS today.    Exam:  Vitals: Refer to EMR. Constitutional:  WD, WN, NAD HEENT:  NCAT, EOMI Neuro/Psych:  Alert & oriented to person, place, and time; appropriate mood & affect Lymphatic: No generalized UE edema or lymphadenopathy Extremities / MSK:  Both UE are normal with respect to appearance, ranges of motion, joint stability, muscle strength/tone, sensation, & perfusion except as otherwise noted:  The right elbow has a posterior bilobed or dumbbell shaped mass each 1 about 3-4 cm.  There is no redness or warmth.  Elbow motion is full.  The more proximal one is perhaps slightly firmer and more rubbery than the distal.  Both masses live more central than the ulnar nerve, which can be palpated separately from the mass.  Labs / Xrays:  No radiographic studies obtained today.  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  An 18-gauge needle was advanced into the distal mass, and aspiration yielded thin straw-colored serous fluid, about 15 mL.  This deflated nearly entirely that mass, still with some palpable soft tissue remnant.  A Band-Aid was applied.  The more proximal mass was aspirated in the same way, and no significant fluid was obtained.  The needle was withdrawn.  A Band-Aid was applied.  Plan:  I discussed these findings with him and with his mother's significant other.  I reviewed options of continued observation versus surgical excision and he thinks he like to have the mass excised at this point.  We will work to get this set up and contact him for scheduling purposes.  The details of the operative procedure were discussed with the patient.  Questions were invited and answered.  In addition to the goal of the procedure, the risks of the procedure to include but not limited to bleeding; infection; damage to the nerves or blood vessels that could result in bleeding, numbness, weakness, chronic pain, and the need for additional procedures; stiffness; the need for revision surgery; and anesthetic risks were reviewed.  No specific outcome was guaranteed or implied.  Informed consent was obtained.

## 2021-12-04 ENCOUNTER — Other Ambulatory Visit: Payer: Self-pay

## 2021-12-04 ENCOUNTER — Ambulatory Visit (HOSPITAL_BASED_OUTPATIENT_CLINIC_OR_DEPARTMENT_OTHER): Payer: No Typology Code available for payment source | Admitting: Anesthesiology

## 2021-12-04 ENCOUNTER — Encounter (HOSPITAL_BASED_OUTPATIENT_CLINIC_OR_DEPARTMENT_OTHER): Payer: Self-pay | Admitting: Orthopedic Surgery

## 2021-12-04 ENCOUNTER — Ambulatory Visit (HOSPITAL_BASED_OUTPATIENT_CLINIC_OR_DEPARTMENT_OTHER)
Admission: RE | Admit: 2021-12-04 | Discharge: 2021-12-04 | Disposition: A | Discharge status: Home / Self Care | Payer: No Typology Code available for payment source | Attending: Orthopedic Surgery | Admitting: Orthopedic Surgery

## 2021-12-04 ENCOUNTER — Encounter (HOSPITAL_BASED_OUTPATIENT_CLINIC_OR_DEPARTMENT_OTHER)
Admission: RE | Disposition: A | Discharge status: Home / Self Care | Payer: Self-pay | Source: Home / Self Care | Attending: Orthopedic Surgery

## 2021-12-04 DIAGNOSIS — M7021 Olecranon bursitis, right elbow: Secondary | ICD-10-CM | POA: Insufficient documentation

## 2021-12-04 DIAGNOSIS — L720 Epidermal cyst: Secondary | ICD-10-CM | POA: Insufficient documentation

## 2021-12-04 DIAGNOSIS — R2231 Localized swelling, mass and lump, right upper limb: Secondary | ICD-10-CM

## 2021-12-04 DIAGNOSIS — K219 Gastro-esophageal reflux disease without esophagitis: Secondary | ICD-10-CM | POA: Insufficient documentation

## 2021-12-04 DIAGNOSIS — F418 Other specified anxiety disorders: Secondary | ICD-10-CM

## 2021-12-04 DIAGNOSIS — E119 Type 2 diabetes mellitus without complications: Secondary | ICD-10-CM

## 2021-12-04 DIAGNOSIS — Z01818 Encounter for other preprocedural examination: Secondary | ICD-10-CM

## 2021-12-04 DIAGNOSIS — M1990 Unspecified osteoarthritis, unspecified site: Secondary | ICD-10-CM | POA: Insufficient documentation

## 2021-12-04 DIAGNOSIS — F319 Bipolar disorder, unspecified: Secondary | ICD-10-CM | POA: Insufficient documentation

## 2021-12-04 DIAGNOSIS — F419 Anxiety disorder, unspecified: Secondary | ICD-10-CM | POA: Insufficient documentation

## 2021-12-04 HISTORY — PX: MASS EXCISION: SHX2000

## 2021-12-04 HISTORY — PX: OLECRANON BURSECTOMY: SHX2097

## 2021-12-04 SURGERY — EXCISION MASS
Anesthesia: Monitor Anesthesia Care | Site: Elbow | Laterality: Right

## 2021-12-04 MED ORDER — MIDAZOLAM HCL 5 MG/5ML IJ SOLN
INTRAMUSCULAR | Status: DC | PRN
  Administered 2021-12-04: 2 mg via INTRAVENOUS

## 2021-12-04 MED ORDER — OXYCODONE HCL 5 MG PO TABS: 5.0000 mg | ORAL_TABLET | Freq: Four times a day (QID) | ORAL | 0 refills | Status: DC | PRN

## 2021-12-04 MED ORDER — MIDAZOLAM HCL 2 MG/2ML IJ SOLN
INTRAMUSCULAR | Status: AC
  Filled 2021-12-04: qty 2

## 2021-12-04 MED ORDER — FENTANYL CITRATE (PF) 100 MCG/2ML IJ SOLN: 25.0000 ug | INTRAMUSCULAR | Status: DC | PRN

## 2021-12-04 MED ORDER — PROPOFOL 500 MG/50ML IV EMUL
INTRAVENOUS | Status: AC
  Filled 2021-12-04: qty 100

## 2021-12-04 MED ORDER — FENTANYL CITRATE (PF) 100 MCG/2ML IJ SOLN
100.0000 ug | Freq: Once | INTRAMUSCULAR | Status: AC
  Administered 2021-12-04: 100 ug via INTRAVENOUS

## 2021-12-04 MED ORDER — DEXMEDETOMIDINE (PRECEDEX) IN NS 20 MCG/5ML (4 MCG/ML) IV SYRINGE
PREFILLED_SYRINGE | INTRAVENOUS | Status: DC | PRN
  Administered 2021-12-04: 8 ug via INTRAVENOUS

## 2021-12-04 MED ORDER — VANCOMYCIN HCL IN DEXTROSE 1-5 GM/200ML-% IV SOLN
INTRAVENOUS | Status: AC
  Filled 2021-12-04: qty 200

## 2021-12-04 MED ORDER — LIDOCAINE 2% (20 MG/ML) 5 ML SYRINGE
INTRAMUSCULAR | Status: AC
  Filled 2021-12-04: qty 5

## 2021-12-04 MED ORDER — ACETAMINOPHEN 325 MG PO TABS: 650.0000 mg | ORAL_TABLET | Freq: Four times a day (QID) | ORAL | Status: AC

## 2021-12-04 MED ORDER — OXYCODONE HCL 5 MG/5ML PO SOLN: 5.0000 mg | Freq: Once | ORAL | Status: DC | PRN

## 2021-12-04 MED ORDER — LACTATED RINGERS IV SOLN: INTRAVENOUS | Status: DC

## 2021-12-04 MED ORDER — VANCOMYCIN HCL IN DEXTROSE 1-5 GM/200ML-% IV SOLN
1000.0000 mg | INTRAVENOUS | Status: AC
  Administered 2021-12-04: 1000 mg via INTRAVENOUS

## 2021-12-04 MED ORDER — PROPOFOL 10 MG/ML IV BOLUS
INTRAVENOUS | Status: DC | PRN
  Administered 2021-12-04: 20 mg via INTRAVENOUS
  Administered 2021-12-04: 30 mg via INTRAVENOUS

## 2021-12-04 MED ORDER — ACETAMINOPHEN 160 MG/5ML PO SOLN: 1000.0000 mg | Freq: Once | ORAL | Status: DC | PRN

## 2021-12-04 MED ORDER — FENTANYL CITRATE (PF) 100 MCG/2ML IJ SOLN
INTRAMUSCULAR | Status: DC | PRN
Start: 2021-12-04 — End: 2021-12-04
  Administered 2021-12-04 (×2): 50 ug via INTRAVENOUS

## 2021-12-04 MED ORDER — ACETAMINOPHEN 10 MG/ML IV SOLN: 1000.0000 mg | Freq: Once | INTRAVENOUS | Status: DC | PRN

## 2021-12-04 MED ORDER — FENTANYL CITRATE (PF) 100 MCG/2ML IJ SOLN
INTRAMUSCULAR | Status: AC
  Filled 2021-12-04: qty 2

## 2021-12-04 MED ORDER — ONDANSETRON HCL 4 MG/2ML IJ SOLN
INTRAMUSCULAR | Status: DC | PRN
  Administered 2021-12-04: 4 mg via INTRAVENOUS

## 2021-12-04 MED ORDER — ACETAMINOPHEN 500 MG PO TABS: 1000.0000 mg | ORAL_TABLET | Freq: Once | ORAL | Status: DC | PRN

## 2021-12-04 MED ORDER — MIDAZOLAM HCL 2 MG/2ML IJ SOLN
2.0000 mg | Freq: Once | INTRAMUSCULAR | Status: AC
  Administered 2021-12-04: 2 mg via INTRAVENOUS

## 2021-12-04 MED ORDER — OXYCODONE HCL 5 MG PO TABS: 5.0000 mg | ORAL_TABLET | Freq: Once | ORAL | Status: DC | PRN

## 2021-12-04 MED ORDER — PROPOFOL 10 MG/ML IV BOLUS
INTRAVENOUS | Status: AC
  Filled 2021-12-04: qty 20

## 2021-12-04 MED ORDER — IBUPROFEN 200 MG PO TABS: 600.0000 mg | ORAL_TABLET | Freq: Four times a day (QID) | ORAL | Status: DC

## 2021-12-04 MED ORDER — BUPIVACAINE HCL (PF) 0.5 % IJ SOLN
INTRAMUSCULAR | Status: DC | PRN
  Administered 2021-12-04: 30 mL via PERINEURAL

## 2021-12-04 MED ORDER — PROPOFOL 500 MG/50ML IV EMUL
INTRAVENOUS | Status: DC | PRN
  Administered 2021-12-04: 50 ug/kg/min via INTRAVENOUS

## 2021-12-04 SURGICAL SUPPLY — 60 items
APL PRP STRL LF DISP 70% ISPRP (MISCELLANEOUS) ×1
BAND INSRT 18 STRL LF DISP RB (MISCELLANEOUS)
BAND RUBBER #18 3X1/16 STRL (MISCELLANEOUS) IMPLANT
BANDAGE GAUZE 1X75IN STRL (MISCELLANEOUS) IMPLANT
BLADE MINI RND TIP GREEN BEAV (BLADE) IMPLANT
BLADE SURG 15 STRL LF DISP TIS (BLADE) ×1 IMPLANT
BLADE SURG 15 STRL SS (BLADE) ×2
BNDG CMPR 5X2 CHSV 1 LYR STRL (GAUZE/BANDAGES/DRESSINGS)
BNDG CMPR 75X11 PLY HI ABS (MISCELLANEOUS)
BNDG CMPR 9X4 STRL LF SNTH (GAUZE/BANDAGES/DRESSINGS)
BNDG COHESIVE 2X5 TAN ST LF (GAUZE/BANDAGES/DRESSINGS) IMPLANT
BNDG COHESIVE 4X5 TAN ST LF (GAUZE/BANDAGES/DRESSINGS) ×2 IMPLANT
BNDG ESMARK 4X9 LF (GAUZE/BANDAGES/DRESSINGS) IMPLANT
BNDG GAUZE 1X75IN STRL (MISCELLANEOUS)
BNDG GAUZE DERMACEA FLUFF (GAUZE/BANDAGES/DRESSINGS) ×1
BNDG GAUZE DERMACEA FLUFF 4 (GAUZE/BANDAGES/DRESSINGS) ×1 IMPLANT
BNDG GZE DERMACEA 4 6PLY (GAUZE/BANDAGES/DRESSINGS) ×1
CHLORAPREP W/TINT 26 (MISCELLANEOUS) ×2 IMPLANT
CORD BIPOLAR FORCEPS 12FT (ELECTRODE) IMPLANT
COVER BACK TABLE 60X90IN (DRAPES) ×2 IMPLANT
COVER MAYO STAND STRL (DRAPES) ×2 IMPLANT
CUFF TOURN SGL QUICK 18X4 (TOURNIQUET CUFF) IMPLANT
DRAIN PENROSE .5X12 LATEX STL (DRAIN) IMPLANT
DRAPE EXTREMITY T 121X128X90 (DISPOSABLE) ×2 IMPLANT
DRAPE SURG 17X23 STRL (DRAPES) ×2 IMPLANT
DRAPE U-SHAPE 47X51 STRL (DRAPES) IMPLANT
DRSG EMULSION OIL 3X3 NADH (GAUZE/BANDAGES/DRESSINGS) ×2 IMPLANT
GAUZE SPONGE 4X4 12PLY STRL (GAUZE/BANDAGES/DRESSINGS) ×1 IMPLANT
GAUZE SPONGE 4X4 12PLY STRL LF (GAUZE/BANDAGES/DRESSINGS) ×2 IMPLANT
GLOVE BIO SURGEON STRL SZ7.5 (GLOVE) ×2 IMPLANT
GLOVE BIOGEL PI IND STRL 7.0 (GLOVE) ×1 IMPLANT
GLOVE BIOGEL PI IND STRL 8 (GLOVE) ×1 IMPLANT
GLOVE BIOGEL PI INDICATOR 7.0 (GLOVE) ×1
GLOVE BIOGEL PI INDICATOR 8 (GLOVE) ×1
GLOVE ECLIPSE 6.5 STRL STRAW (GLOVE) ×2 IMPLANT
GOWN STRL REUS W/ TWL LRG LVL3 (GOWN DISPOSABLE) ×2 IMPLANT
GOWN STRL REUS W/TWL LRG LVL3 (GOWN DISPOSABLE) ×4
GOWN STRL REUS W/TWL XL LVL3 (GOWN DISPOSABLE) ×2 IMPLANT
NDL HYPO 25X1 1.5 SAFETY (NEEDLE) IMPLANT
NEEDLE HYPO 25X1 1.5 SAFETY (NEEDLE) IMPLANT
NS IRRIG 1000ML POUR BTL (IV SOLUTION) ×2 IMPLANT
PACK BASIN DAY SURGERY FS (CUSTOM PROCEDURE TRAY) ×2 IMPLANT
PAD CAST 4YDX4 CTTN HI CHSV (CAST SUPPLIES) IMPLANT
PADDING CAST ABS 4INX4YD NS (CAST SUPPLIES)
PADDING CAST ABS COTTON 4X4 ST (CAST SUPPLIES) IMPLANT
PADDING CAST COTTON 4X4 STRL (CAST SUPPLIES) ×2
PENCIL SMOKE EVACUATOR (MISCELLANEOUS) ×1 IMPLANT
SLING ARM IMMOBILIZER LRG (SOFTGOODS) ×1 IMPLANT
SPLINT FIBERGLASS 3X35 (CAST SUPPLIES) ×1 IMPLANT
STAPLER VISISTAT 35W (STAPLE) ×1 IMPLANT
STOCKINETTE 6  STRL (DRAPES) ×2
STOCKINETTE 6 STRL (DRAPES) ×1 IMPLANT
SUT VIC AB 3-0 CT1 27 (SUTURE) ×2
SUT VIC AB 3-0 CT1 TAPERPNT 27 (SUTURE) IMPLANT
SUT VICRYL RAPIDE 4-0 (SUTURE) IMPLANT
SUT VICRYL RAPIDE 4/0 PS 2 (SUTURE) IMPLANT
SYR 10ML LL (SYRINGE) IMPLANT
SYR BULB EAR ULCER 3OZ GRN STR (SYRINGE) ×2 IMPLANT
TOWEL GREEN STERILE FF (TOWEL DISPOSABLE) ×2 IMPLANT
UNDERPAD 30X36 HEAVY ABSORB (UNDERPADS AND DIAPERS) ×2 IMPLANT

## 2021-12-04 NOTE — Discharge Instructions (Addendum)
Discharge Instructions   You have a dressing with a plaster splint incorporated in it. Move your fingers as much as possible, making a full fist and fully opening the fist. Elevate your hand to reduce pain & swelling of the digits.  Ice over the operative site may be helpful to reduce pain & swelling.  DO NOT USE HEAT. Pain medicine has been prescribed for you.  Take Tylenol 650 mg and Ibuprofen 600 mg OTC every 6 hours together.Take the Oxycodone 5 mg as a rescue drug. Leave the dressing in place until you return to our office.  You may shower, but keep the bandage clean & dry.  You may drive a car when you are off of prescription pain medications and can safely control your vehicle with both hands. Call our office to schedule a post operative appointment for 10-15 days from the date of surgery.   Please call [phone] during normal business hours or [phone] after hours for any problems. Including the following:  - excessive redness of the incisions - drainage for more than 4 days - fever of more than 101.5 F  *Please note that pain medications will not be refilled after hours or on weekends.   NO work with right arm. Post Anesthesia Home Care Instructions  Activity: Get plenty of rest for the remainder of the day. A responsible individual must stay with you for 24 hours following the procedure.  For the next 24 hours, DO NOT: -Drive a car -Advertising copywriter -Drink alcoholic beverages -Take any medication unless instructed by your physician -Make any legal decisions or sign important papers.  Meals: Start with liquid foods such as gelatin or soup. Progress to regular foods as tolerated. Avoid greasy, spicy, heavy foods. If nausea and/or vomiting occur, drink only clear liquids until the nausea and/or vomiting subsides. Call your physician if vomiting continues.  Special Instructions/Symptoms: Your throat may feel dry or sore from the anesthesia or the breathing tube  placed in your throat during surgery. If this causes discomfort, gargle with warm salt water. The discomfort should disappear within 24 hours.  If you had a scopolamine patch placed behind your ear for the management of post- operative nausea and/or vomiting:  1. The medication in the patch is effective for 72 hours, after which it should be removed.  Wrap patch in a tissue and discard in the trash. Wash hands thoroughly with soap and water. 2. You may remove the patch earlier than 72 hours if you experience unpleasant side effects which may include dry mouth, dizziness or visual disturbances. 3. Avoid touching the patch. Wash your hands with soap and water after contact with the patch.    Regional Anesthesia Blocks  1. Numbness or the inability to move the "blocked" extremity may last from 3-48 hours after placement. The length of time depends on the medication injected and your individual response to the medication. If the numbness is not going away after 48 hours, call your surgeon.  2. The extremity that is blocked will need to be protected until the numbness is gone and the  Strength has returned. Because you cannot feel it, you will need to take extra care to avoid injury. Because it may be weak, you may have difficulty moving it or using it. You may not know what position it is in without looking at it while the block is in effect.  3. For blocks in the legs and feet, returning to weight bearing and walking needs to be done  carefully. You will need to wait until the numbness is entirely gone and the strength has returned. You should be able to move your leg and foot normally before you try and bear weight or walk. You will need someone to be with you when you first try to ensure you do not fall and possibly risk injury.  4. Bruising and tenderness at the needle site are common side effects and will resolve in a few days.  5. Persistent numbness or new problems with movement should be  communicated to the surgeon or the [facility] [phone] [facility] [phone]).

## 2021-12-04 NOTE — Op Note (Signed)
12/04/2021  2:25 PM  PATIENT:  Alex Turner  51 y.o. male  PRE-OPERATIVE DIAGNOSIS: Right posterior elbow mass and adjacent olecranon bursitis  POST-OPERATIVE DIAGNOSIS:  Same  PROCEDURE:  1.  Excision of right posterior elbow soft tissue mass, 4 cm    2.  Radical right elbow olecranon bursectomy  SURGEON: Rayvon Char. Grandville Silos, MD  PHYSICIAN ASSISTANT: Morley Kos, OPA-C  ANESTHESIA: Regional/MAC  SPECIMENS:  None  DRAINS:   None  EBL: Less than 10 mL  PREOPERATIVE INDICATIONS:  Alex Turner is a  51 y.o. male with a longstanding chronic history of a rounded mass on the posterior aspect of the elbow.  More recently, distal to this, he developed an enlargement thought consistent with olecranon bursitis.  It was aspirated and indeed yielded typical serous fluid, but over the course of a few days began to actually recur.  He desired operative treatment to remove both the longstanding mass, and address the olecranon bursitis.  The risks benefits and alternatives were discussed with the patient preoperatively including but not limited to the risks of infection, bleeding, nerve injury, cardiopulmonary complications, the need for revision surgery, among others, and the patient verbalized understanding and consented to proceed.  OPERATIVE IMPLANTS: None  OPERATIVE PROCEDURE:  After receiving prophylactic antibiotics and a regional block, the patient was escorted to the operative theatre and placed in a supine position.  A surgical "time-out" was performed during which the planned procedure, proposed operative site, and the correct patient identity were compared to the operative consent and agreement confirmed by the circulating nurse according to current facility policy.  Following application of a tourniquet to the operative extremity, the exposed skin was prepped with Chloraprep and draped in the usual sterile fashion.  The limb was exsanguinated with an Esmarch bandage and the  tourniquet inflated to approximately 162mHg higher than systolic BP.  Initially, 2 incisions were planned, but then ultimately this was abandoned in favor of 1 longer incision by connecting them.  Full-thickness flaps were elevated.  More distally, typical bursal fluid was expressed and was serous, decompressing the bursa.  More proximally, the rounded mass had the consistency of perhaps a sebaceous cyst or something similar, with a gray-white appearance and a rubbery consistency.  The mass was meticulously marginally excised and it was encapsulated.  It was passed off for pathological evaluation.  The olecranon bursal tissue was all excised radically with combinations of rondure's, knife, scissors, and electrocautery.  There was granulation tissue adherent to the tip of the olecranon and this was excised as well.  The anchorage of the triceps was good, and the ulnar nerve was identified and protected throughout the course of the procedure.  The tourniquet was released and additional hemostasis obtained with bipolar electrocautery, monopolar electrocautery, and direct pressure.  Decision was made to place a tourniquet back up for the remainder of the wound closure.  The wound was irrigated and the skin was reapproximated with 3-0 Vicryl deep dermal buried subcuticular sutures and staples.  A long-arm dressing was applied with a palmar or anterior fiberglass splint placing the elbow in about 35 to 45 degrees of flexion and once the compressive dressing has been applied, the tourniquet was released.  He was taken to the recovery room in stable condition.  DISPOSITION: He will be discharged home with typical instructions returning in 10 to 15 days.  If the wound appears appropriate staples can be removed and motion initiated.

## 2021-12-04 NOTE — Anesthesia Preprocedure Evaluation (Addendum)
Anesthesia Evaluation  Patient identified by MRN, date of birth, ID band Patient awake    Reviewed: Allergy & Precautions, NPO status , Patient's Chart, lab work & pertinent test results  History of Anesthesia Complications Negative for: history of anesthetic complications  Airway Mallampati: I  TM Distance: >3 FB Neck ROM: Full    Dental  (+) Missing, Poor Dentition, Chipped, Dental Advisory Given,    Pulmonary neg shortness of breath, neg sleep apnea, neg COPD, neg recent URI, Current Smoker and Patient abstained from smoking.,    breath sounds clear to auscultation       Cardiovascular negative cardio ROS   Rhythm:Regular     Neuro/Psych PSYCHIATRIC DISORDERS Anxiety Depression Bipolar Disorder Schizophrenia TIA   GI/Hepatic GERD  ,  Endo/Other  diabetesLab Results      Component                Value               Date                      HGBA1C                   5.8 (H)             09/05/2014             Renal/GU negative Renal ROS     Musculoskeletal  (+) Arthritis ,   Abdominal   Peds  Hematology negative hematology ROS (+) Lab Results      Component                Value               Date                      WBC                      9.2                 09/03/2014                HGB                      14.7                09/03/2014                HCT                      44.8                09/03/2014                MCV                      82.2                09/03/2014                PLT                      282                 09/03/2014              Anesthesia Other Findings  Reproductive/Obstetrics                            Anesthesia Physical Anesthesia Plan  ASA: 3  Anesthesia Plan: MAC and Regional   Post-op Pain Management: Regional block*   Induction:   PONV Risk Score and Plan: 0 and Propofol infusion and Treatment may vary due to age or medical  condition  Airway Management Planned: Nasal Cannula, Natural Airway and Simple Face Mask  Additional Equipment: None  Intra-op Plan:   Post-operative Plan:   Informed Consent: I have reviewed the patients History and Physical, chart, labs and discussed the procedure including the risks, benefits and alternatives for the proposed anesthesia with the patient or authorized representative who has indicated his/her understanding and acceptance.     Dental advisory given  Plan Discussed with: CRNA  Anesthesia Plan Comments:         Anesthesia Quick Evaluation

## 2021-12-04 NOTE — Anesthesia Procedure Notes (Addendum)
Anesthesia Regional Block: Supraclavicular block   Pre-Anesthetic Checklist: , timeout performed,  Correct Patient, Correct Site, Correct Laterality,  Correct Procedure, Correct Position, site marked,  Risks and benefits discussed,  Surgical consent,  Pre-op evaluation,  At surgeon's request and post-op pain management  Laterality: Right and Upper  Prep: chloraprep       Needles:  Injection technique: Single-shot  Needle Type: Echogenic Stimulator Needle     Needle Length: 5cm  Needle Gauge: 22     Additional Needles: Arrow StimuQuik ECHO Echogenic Stimulating PNB Needle  Procedures:,,,, ultrasound used (permanent image in chart),,    Narrative:  Start time: 12/04/2021 11:11 AM End time: 12/04/2021 11:19 AM Injection made incrementally with aspirations every 5 mL.  Performed by: Personally  Anesthesiologist: Val Eagle, MD

## 2021-12-04 NOTE — Transfer of Care (Signed)
Immediate Anesthesia Transfer of Care Note  Patient: Alex Turner  Procedure(s) Performed: RIGHT ELBOW POSTERIOR MASS EXCISION (Right: Elbow) OLECRANON BURSECTOMY (Right: Elbow)  Patient Location: PACU  Anesthesia Type:MAC combined with regional for post-op pain  Level of Consciousness: awake, alert  and oriented  Airway & Oxygen Therapy: Patient Spontanous Breathing and Patient connected to face mask oxygen  Post-op Assessment: Report given to RN and Post -op Vital signs reviewed and stable  Post vital signs: Reviewed and stable  Last Vitals:  Vitals Value Taken Time  BP    Temp    Pulse 50 12/04/21 1534  Resp 24 12/04/21 1534  SpO2 98 % 12/04/21 1534  Vitals shown include unvalidated device data.  Last Pain:  Vitals:   12/04/21 1021  TempSrc: Oral  PainSc: 0-No pain      Patients Stated Pain Goal: 5 (29/09/03 0149)  Complications: No notable events documented.

## 2021-12-04 NOTE — Progress Notes (Signed)
Assisted Dr. Moser with right, supraclavicular, ultrasound guided block. Side rails up, monitors on throughout procedure. See vital signs in flow sheet. Tolerated Procedure well. 

## 2021-12-04 NOTE — Anesthesia Postprocedure Evaluation (Signed)
Anesthesia Post Note  Patient: Alex Turner  Procedure(s) Performed: RIGHT ELBOW POSTERIOR MASS EXCISION (Right: Elbow) OLECRANON BURSECTOMY (Right: Elbow)     Patient location during evaluation: PACU Anesthesia Type: Regional and MAC Level of consciousness: awake and alert Pain management: pain level controlled Vital Signs Assessment: post-procedure vital signs reviewed and stable Respiratory status: spontaneous breathing, nonlabored ventilation and respiratory function stable Cardiovascular status: stable and blood pressure returned to baseline Postop Assessment: no apparent nausea or vomiting Anesthetic complications: no   No notable events documented.  Last Vitals:  Vitals:   12/04/21 1545 12/04/21 1604  BP: 115/70 114/72  Pulse: (!) 49 (!) 51  Resp: 20 18  Temp:  36.4 C  SpO2: 96% 95%    Last Pain:  Vitals:   12/04/21 1604  TempSrc: Oral  PainSc: 2                  Lamyah Creed

## 2021-12-04 NOTE — Interval H&P Note (Signed)
History and Physical Interval Note:  12/04/2021 2:25 PM  Alex Turner  has presented today for surgery, with the diagnosis of RIGHT POSTERIOR ELBOW MASS AND OLECRANON BURSITIS.  The various methods of treatment have been discussed with the patient and family. After consideration of risks, benefits and other options for treatment, the patient has consented to  Procedure(s): RIGHT ELBOW POSTERIOR MASS EXCISION (Right) OLECRANON BURSECTOMY (Right) as a surgical intervention.  The patient's history has been reviewed, patient examined, no change in status, stable for surgery.  I have reviewed the patient's chart and labs.  Questions were answered to the patient's satisfaction.     Jodi Marble

## 2021-12-07 ENCOUNTER — Encounter (HOSPITAL_BASED_OUTPATIENT_CLINIC_OR_DEPARTMENT_OTHER): Payer: Self-pay | Admitting: Orthopedic Surgery

## 2021-12-07 LAB — SURGICAL PATHOLOGY

## 2021-12-31 ENCOUNTER — Encounter (HOSPITAL_BASED_OUTPATIENT_CLINIC_OR_DEPARTMENT_OTHER): Payer: Self-pay | Admitting: Orthopedic Surgery

## 2023-05-22 DIAGNOSIS — Z419 Encounter for procedure for purposes other than remedying health state, unspecified: Secondary | ICD-10-CM | POA: Diagnosis not present

## 2023-05-26 ENCOUNTER — Emergency Department (HOSPITAL_COMMUNITY): Payer: Medicaid Other

## 2023-05-26 ENCOUNTER — Emergency Department (HOSPITAL_COMMUNITY)
Admission: EM | Admit: 2023-05-26 | Discharge: 2023-05-27 | Disposition: A | Discharge status: Home / Self Care | Payer: Medicaid Other | Attending: Emergency Medicine | Admitting: Emergency Medicine

## 2023-05-26 ENCOUNTER — Encounter (HOSPITAL_COMMUNITY): Payer: Self-pay

## 2023-05-26 DIAGNOSIS — R9431 Abnormal electrocardiogram [ECG] [EKG]: Secondary | ICD-10-CM | POA: Diagnosis not present

## 2023-05-26 DIAGNOSIS — R200 Anesthesia of skin: Secondary | ICD-10-CM | POA: Diagnosis not present

## 2023-05-26 DIAGNOSIS — R202 Paresthesia of skin: Secondary | ICD-10-CM | POA: Diagnosis not present

## 2023-05-26 LAB — CBC WITH DIFFERENTIAL/PLATELET
Abs Immature Granulocytes: 0.05 10*3/uL (ref 0.00–0.07)
Basophils Absolute: 0.1 10*3/uL (ref 0.0–0.1)
Basophils Relative: 1 %
Eosinophils Absolute: 0.1 10*3/uL (ref 0.0–0.5)
Eosinophils Relative: 1 %
HCT: 49.2 % (ref 39.0–52.0)
Hemoglobin: 16.1 g/dL (ref 13.0–17.0)
Immature Granulocytes: 1 %
Lymphocytes Relative: 25 %
Lymphs Abs: 2.6 10*3/uL (ref 0.7–4.0)
MCH: 27.9 pg (ref 26.0–34.0)
MCHC: 32.7 g/dL (ref 30.0–36.0)
MCV: 85.1 fL (ref 80.0–100.0)
Monocytes Absolute: 0.5 10*3/uL (ref 0.1–1.0)
Monocytes Relative: 4 %
Neutro Abs: 7.3 10*3/uL (ref 1.7–7.7)
Neutrophils Relative %: 68 %
Platelets: 280 10*3/uL (ref 150–400)
RBC: 5.78 MIL/uL (ref 4.22–5.81)
RDW: 13.8 % (ref 11.5–15.5)
WBC: 10.5 10*3/uL (ref 4.0–10.5)
nRBC: 0 % (ref 0.0–0.2)

## 2023-05-26 LAB — BASIC METABOLIC PANEL
Anion gap: 7 (ref 5–15)
BUN: 7 mg/dL (ref 6–20)
CO2: 25 mmol/L (ref 22–32)
Calcium: 9.6 mg/dL (ref 8.9–10.3)
Chloride: 103 mmol/L (ref 98–111)
Creatinine, Ser: 0.87 mg/dL (ref 0.61–1.24)
GFR, Estimated: >60 mL/min (ref >60)
Glucose, Bld: 97 mg/dL (ref 70–99)
Potassium: 3.7 mmol/L (ref 3.5–5.1)
Sodium: 135 mmol/L (ref 135–145)

## 2023-05-26 NOTE — ED Provider Notes (Signed)
[place] EMERGENCY DEPARTMENT AT [facility] Provider Note   CSN: [id number] Arrival date & time: 05/26/23  1352     History  Chief Complaint  Patient presents with   Leg Numbness    Alex Turner is a 52 y.o. male.  52 year old male with 1 week of right lower extremity numbness.  States started in his right lower leg has progressed down to his feet just on the right side.  Having some weakness of his leg as well.  The leg "feels like it is dead".  States there is a pins and needle sensation that starts just above his knee and goes all the way down his right foot.  Feet feels cold and feels asleep.  Denies any pain but does have weakness.  Some intermittent tingling to his right arm as well.  No weakness to his arms.  No facial droop.  No headache or visual changes.  No chest pain or shortness of breath.  No abdominal pain, nausea or vomiting.  Reports injuring his back many years ago but does not have chronic back problems and never had back surgery.  No history of IV drug abuse.  No history of cancer.  No history of bowel or bladder incontinence.  No fever or vomiting. No significant back or neck pain.  The history is provided by the patient.       Home Medications Prior to Admission medications   Medication Sig Start Date End Date Taking? Authorizing Provider  acetaminophen (TYLENOL) 325 MG tablet Take 2 tablets (650 mg total) by mouth every 6 (six) hours. 12/04/21   Mack Hook, MD  benztropine (COGENTIN) 1 MG tablet Take 1 tablet (1 mg total) by mouth 2 (two) times daily in the am and at bedtime.. For prevention of drug induced tremors Patient not taking: Reported on 12/02/2021 09/09/14   Armandina Stammer I, NP  diphenhydrAMINE (BENADRYL) 25 MG tablet Take 25 mg by mouth every 6 (six) hours as needed.    [provider]  fluPHENAZine (PROLIXIN) 2.5 MG tablet Take 3 tablets (7.5 mg total) by mouth 2 (two) times daily in the am and at bedtime.. For mood  control Patient not taking: Reported on 12/02/2021 09/09/14   Armandina Stammer I, NP  gabapentin (NEURONTIN) 100 MG capsule Take 1 capsule (100 mg total) by mouth 3 (three) times daily at 8am, 3pm and bedtime. For agitation Patient not taking: Reported on 12/02/2021 09/09/14   Armandina Stammer I, NP  hydrOXYzine (ATARAX/VISTARIL) 25 MG tablet Take 1 tablet (25 mg total) by mouth every 6 (six) hours as needed for anxiety. Patient not taking: Reported on 12/02/2021 09/09/14   Armandina Stammer I, NP  ibuprofen (ADVIL) 200 MG tablet Take 3 tablets (600 mg total) by mouth every 6 (six) hours. 12/04/21   Mack Hook, MD  omeprazole (PRILOSEC) 20 MG capsule Take 1 capsule (20 mg total) by mouth 2 (two) times daily before a meal. For acid reflux Patient not taking: Reported on 12/02/2021 09/09/14   Armandina Stammer I, NP  OXcarbazepine (TRILEPTAL) 150 MG tablet Take 1 tablet (150 mg total) by mouth 2 (two) times daily. For mood stabilization Patient not taking: Reported on 12/02/2021 09/09/14   Armandina Stammer I, NP  oxyCODONE (ROXICODONE) 5 MG immediate release tablet Take 1 tablet (5 mg total) by mouth every 6 (six) hours as needed for severe pain. 12/04/21   Mack Hook, MD  simvastatin (ZOCOR) 20 MG tablet Take 1 tablet (20 mg total) by  mouth daily at 6 PM. For high cholesterol Patient not taking: Reported on 12/02/2021 09/09/14   Armandina Stammer I, NP  traZODone (DESYREL) 100 MG tablet Take 1 tablet (100 mg total) by mouth at bedtime. For sleep Patient not taking: Reported on 12/02/2021 09/09/14   Armandina Stammer I, NP      Allergies    Bee venom, Tape, Penicillins, and Wellbutrin [bupropion hcl]    Review of Systems   Review of Systems  Constitutional:  Negative for activity change, appetite change and fever.  HENT:  Negative for congestion.   Respiratory:  Negative for chest tightness and shortness of breath.   Cardiovascular:  Negative for chest pain.  Gastrointestinal:  Negative for abdominal pain.  Genitourinary:   Negative for dysuria and hematuria.  Musculoskeletal:  Negative for back pain and myalgias.  Neurological:  Positive for weakness and numbness. Negative for dizziness and headaches.    all other systems are negative except as noted in the HPI and PMH.   Physical Exam Updated Vital Signs BP (!) 172/105 (BP Location: Right Wrist)   Pulse (!) 58   Temp 98.5 F (36.9 C)   Resp 18   SpO2 91%  Physical Exam Vitals and nursing note reviewed.  Constitutional:      General: He is not in acute distress.    Appearance: He is well-developed.  HENT:     Head: Normocephalic and atraumatic.     Mouth/Throat:     Pharynx: No oropharyngeal exudate.  Eyes:     Conjunctiva/sclera: Conjunctivae normal.     Pupils: Pupils are equal, round, and reactive to light.  Neck:     Comments: No meningismus. Cardiovascular:     Rate and Rhythm: Normal rate and regular rhythm.     Heart sounds: Normal heart sounds. No murmur heard. Pulmonary:     Effort: Pulmonary effort is normal. No respiratory distress.     Breath sounds: Normal breath sounds.  Abdominal:     Palpations: Abdomen is soft.     Tenderness: There is no abdominal tenderness. There is no guarding or rebound.  Musculoskeletal:        General: No tenderness. Normal range of motion.     Cervical back: Normal range of motion and neck supple.     Comments: 4/5 strength in  right lower extremity compared to 5/5 strength on R. Ankle plantar and dorsiflexion intact. Great toe extension intact bilaterally. +2 DP and PT pulses. +2 patellar reflexes bilaterally. Normal gait.   Skin:    General: Skin is warm.  Neurological:     Mental Status: He is alert and oriented to person, place, and time.     Cranial Nerves: No cranial nerve deficit.     Motor: No abnormal muscle tone.     Coordination: Coordination normal.     Comments:  5/5 strength throughout. CN 2-12 intact.Equal grip strength.   Psychiatric:        Behavior: Behavior normal.      ED Results / Procedures / Treatments   Labs (all labs ordered are listed, but only abnormal results are displayed) Labs Reviewed  CBC WITH DIFFERENTIAL/PLATELET  BASIC METABOLIC PANEL  TROPONIN I (HIGH SENSITIVITY)    EKG EKG Interpretation Date/Time:  Friday May 27 2023 00:03:13 EST Ventricular Rate:  57 PR Interval:  160 QRS Duration:  106 QT Interval:  428 QTC Calculation: 417 R Axis:   90  Text Interpretation: Sinus rhythm Borderline right axis deviation No significant change  was found Confirmed by Glynn Octave [phone]) on 05/27/2023 1:25:06 AM  Radiology MR BRAIN WO CONTRAST  Result Date: 05/27/2023 CLINICAL DATA:  Right lower extremity weakness EXAM: MRI HEAD WITHOUT CONTRAST TECHNIQUE: Multiplanar, multiecho pulse sequences of the brain and surrounding structures were obtained without intravenous contrast. COMPARISON:  05/08/2014 FINDINGS: Brain: No acute infarct, mass effect or extra-axial collection. No chronic microhemorrhage or siderosis. Normal white matter signal, parenchymal volume and CSF spaces. The midline structures are normal. Vascular: Normal flow voids. Skull and upper cervical spine: Normal marrow signal. Sinuses/Orbits: Negative. Other: None. IMPRESSION: Normal brain MRI. Electronically Signed   By: Deatra Robinson M.D.   On: 05/27/2023 00:46   MR LUMBAR SPINE WO CONTRAST  Result Date: 05/26/2023 CLINICAL DATA:  Lumbar radiculopathy, increased fracture risk. Right lower leg numbness, now with weakness. EXAM: MRI LUMBAR SPINE WITHOUT CONTRAST TECHNIQUE: Multiplanar, multisequence MR imaging of the lumbar spine was performed. No intravenous contrast was administered. COMPARISON:  None Available. FINDINGS: Segmentation:  Standard. Alignment:  Physiologic. Vertebrae:  No fracture, evidence of discitis, or bone lesion. Conus medullaris and cauda equina: Conus extends to the L1-2 level. Conus and cauda equina appear normal. Paraspinal and other soft tissues:  No perispinal mass or inflammation. Disc levels: T12- L1: Unremarkable. L1-L2: Disc narrowing and desiccation with posterior annular fissure. No neural compression L2-L3: Disc desiccation and narrowing with circumferential bulging. No neural compression L3-L4: Disc narrowing and bulging with mild ventral thecal sac mass effect. No neural impingement L4-L5: Mild degenerative facet spurring on both sides. No neural impingement L5-S1:Mild degenerative facet spurring.  No neural impingement. IMPRESSION: Overall mild lumbar spine degeneration as described. No impingement or inflammation to explain leg symptoms. Electronically Signed   By: Tiburcio Pea M.D.   On: 05/26/2023 20:19    Procedures Procedures    Medications Ordered in ED Medications - No data to display  ED Course/ Medical Decision Making/ A&P                                 Medical Decision Making Amount and/or Complexity of Data Reviewed Labs: ordered. Decision-making details documented in ED Course. Radiology: ordered and independent interpretation performed. Decision-making details documented in ED Course. ECG/medicine tests: ordered and independent interpretation performed. Decision-making details documented in ED Course.  Risk Prescription drug management.   1 week of right lower leg numbness and weakness.  No bowel or bladder incontinence.  No fever or vomiting.  Mildly decreased strength on the right with subjective paresthesias.  Intact reflexes.  MRI of lumbar spine obtained in triage is negative for cord compression or cauda equina or large bulging disc. MRI lumbar spine does not explain numbness to his right leg.  MRI brain is pursued which shows no evidence of acute infarct.  Lab work is reassuring. On exam patient has intact strength, pulses and reflexes.  Low concern for Guillain-Barr syndrome Electrolyte stable. K normal.   No asymmetric swelling or edema or pain to palpation.  Low suspicion for DVT.  No  evidence of acute limb ischemia.  Lab work is reassuring.  Will refer to neurology for further evaluation of his right leg numbness. Empiric steroids to be given with concern for possible lumbar radiculopathy  Will refer to neurology for EMG and other followup for RLE numbness. Does not appear to be a brain or spinal cord issue.  Consider peripheral nerve issue.   Return to the ED with worsening  pain, weakness, numbness, tingling, bowel or bladder incontinence.  Will give empiric steroids to treat for possible lumbar radiculopathy despite MRI findings.       Final Clinical Impression(s) / ED Diagnoses Final diagnoses:  Right leg numbness    Rx / DC Orders ED Discharge Orders     None         Zyliah Schier, Jeannett Senior, MD 05/27/23 0207

## 2023-05-26 NOTE — ED Provider Triage Note (Signed)
Emergency Medicine Provider Triage Evaluation Note  Alex Turner , a 52 y.o. male  was evaluated in triage.  Pt complains of right lower leg numbness.  Has been present for 1 week.  He states it is numb and now starting to get weakness.  He does also endorse some mid back pain around the "kidney area."  Patient denies any drug use besides marijuana.  Does not drink alcohol on a regular basis.  Denies fever or chills. Denies any bowel or bladder incontinence.   Review of Systems  Positive:  Negative: See above   Physical Exam  BP (!) 159/94 (BP Location: Right Arm)   Pulse 73   Temp 98.4 F (36.9 C)   Resp 16   SpO2 100%  Gen:   Awake, no distress   Resp:  Normal effort  MSK:   Moves extremities without difficulty  Other:  5/5 Strength to the upper extremities.  There is decreased subjective sensation to the right lower extremity.  3/5 strength to the right lower extremity in comparison to the left which is normal.  Medical Decision Making  Medically screening exam initiated at 3:21 PM.  Appropriate orders placed.  Suella Broad was informed that the remainder of the evaluation will be completed by another provider, this initial triage assessment does not replace that evaluation, and the importance of remaining in the ED until their evaluation is complete.     Honor Loh Wann, [place] 05/26/23 [phone]

## 2023-05-26 NOTE — ED Triage Notes (Signed)
Pt is coming in for right leg numbness that has been going on for around a week. States the numbness feels like it is asleep and it only going from mid thigh down. He does mentions at times it does feel cold but it remains the same color as the other leg. In triage pedal pulses were present and equal bilaterally. Pt is no on any blood thinners, does not have a mentioned history of blood clots, and has full movement of the extremity.

## 2023-05-27 ENCOUNTER — Emergency Department (HOSPITAL_COMMUNITY): Payer: Medicaid Other

## 2023-05-27 LAB — TROPONIN I (HIGH SENSITIVITY): Troponin I (High Sensitivity): 2 ng/L (ref <18)

## 2023-05-27 MED ORDER — METHOCARBAMOL 500 MG PO TABS: 500.0000 mg | ORAL_TABLET | Freq: Three times a day (TID) | ORAL | 0 refills | Status: DC | PRN

## 2023-05-27 MED ORDER — DEXAMETHASONE SODIUM PHOSPHATE 10 MG/ML IJ SOLN
10.0000 mg | Freq: Once | INTRAMUSCULAR | Status: AC
Start: 2023-05-27 — End: 2023-05-27
  Administered 2023-05-27: 10 mg via INTRAMUSCULAR
  Filled 2023-05-27: qty 1

## 2023-05-27 MED ORDER — METHYLPREDNISOLONE 4 MG PO TBPK: ORAL_TABLET | ORAL | 0 refills | Status: DC

## 2023-05-27 NOTE — ED Notes (Signed)
Pt returned from MRI °

## 2023-05-27 NOTE — Discharge Instructions (Signed)
Your testing is negative for any brain or spinal cord abnormality.  Follow-up with the neurologist for further evaluation of your numbness for a test called EMG.  Return to the ED with worsening pain, weakness, numbness, tingling, any other concerns.

## 2023-05-27 NOTE — ED Notes (Signed)
Pt transported to MRI 

## 2023-06-02 ENCOUNTER — Telehealth: Payer: Self-pay | Admitting: Neurology

## 2023-06-02 NOTE — Telephone Encounter (Signed)
Request to be put on wait list

## 2023-06-08 ENCOUNTER — Ambulatory Visit
Admission: RE | Admit: 2023-06-08 | Discharge: 2023-06-08 | Disposition: A | Discharge status: Home / Self Care | Payer: Medicaid Other | Source: Ambulatory Visit | Attending: Internal Medicine | Admitting: Internal Medicine

## 2023-06-08 VITALS — BP 136/92 | HR 66 | Temp 99.0°F | Resp 18 | Ht 66.0 in | Wt 150.9 lb

## 2023-06-08 DIAGNOSIS — R0789 Other chest pain: Secondary | ICD-10-CM

## 2023-06-08 MED ORDER — MELOXICAM 7.5 MG PO TABS: 7.5000 mg | ORAL_TABLET | Freq: Every day | ORAL | 0 refills | Status: AC
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
## 2023-06-08 NOTE — Discharge Instructions (Signed)
Follow-up with primary care doctor as discussed Emergency room for severe symptoms or new symptoms such as shortness of breath, chest pain with activity

## 2023-06-08 NOTE — ED Triage Notes (Signed)
Patient presents with nerve pain in the chest area, states he was seen in ER ON 12/5 and they made him an appointment for Neurology for March 7, "But I need something to help with this pain before then." Patient states he have been treating with the meds from ER and Tylenol without relief.

## 2023-06-13 ENCOUNTER — Inpatient Hospital Stay: Payer: No Typology Code available for payment source | Admitting: Physician Assistant

## 2023-06-22 DIAGNOSIS — Z419 Encounter for procedure for purposes other than remedying health state, unspecified: Secondary | ICD-10-CM | POA: Diagnosis not present

## 2023-07-18 ENCOUNTER — Ambulatory Visit: Payer: Medicaid Other | Admitting: Family Medicine

## 2023-07-18 ENCOUNTER — Encounter: Payer: Self-pay | Admitting: Family Medicine

## 2023-07-18 ENCOUNTER — Other Ambulatory Visit: Payer: Medicaid Other

## 2023-07-18 VITALS — BP 124/68 | HR 61 | Ht 66.0 in | Wt 153.4 lb

## 2023-07-18 DIAGNOSIS — Z9189 Other specified personal risk factors, not elsewhere classified: Secondary | ICD-10-CM

## 2023-07-18 DIAGNOSIS — G589 Mononeuropathy, unspecified: Secondary | ICD-10-CM | POA: Insufficient documentation

## 2023-07-18 DIAGNOSIS — Z7689 Persons encountering health services in other specified circumstances: Secondary | ICD-10-CM

## 2023-07-18 DIAGNOSIS — Z114 Encounter for screening for human immunodeficiency virus [HIV]: Secondary | ICD-10-CM | POA: Diagnosis not present

## 2023-07-18 DIAGNOSIS — Z125 Encounter for screening for malignant neoplasm of prostate: Secondary | ICD-10-CM | POA: Diagnosis not present

## 2023-07-18 DIAGNOSIS — R634 Abnormal weight loss: Secondary | ICD-10-CM | POA: Diagnosis not present

## 2023-07-18 DIAGNOSIS — Z1159 Encounter for screening for other viral diseases: Secondary | ICD-10-CM

## 2023-07-18 DIAGNOSIS — Z720 Tobacco use: Secondary | ICD-10-CM | POA: Insufficient documentation

## 2023-07-18 DIAGNOSIS — G629 Polyneuropathy, unspecified: Secondary | ICD-10-CM | POA: Insufficient documentation
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
## 2023-07-18 NOTE — Patient Instructions (Addendum)
It was nice to see you today,  We addressed the following topics today: -Because of your decreased nerve sensation I am ordering; EMG study to test your nerve function.  Someone will call you to schedule this - Because you have been smoking for a long time I am ordering a CT scan of your chest, which will be repeated yearly.  This will look for lung cancer - I am ordering some blood test due to your symptoms you are complaining of.  I will talked about the results when I get them - I am ordering a x-ray of your right hip femur and knee due to your weakness and decreased sensation.  You can go to [facility] imaging on [address] anytime they are open to have this done.  X-rays do not typically need an appointment.  Have a great day,  Frederic Jericho, MD

## 2023-07-18 NOTE — Assessment & Plan Note (Signed)
Patient states that he has lost about 100 pounds in the past year, but chart review shows that he was at his current weight as far back as 2023.  Not doubting that he had weight loss but the timing may be different than he remembers.  For this and complaints of nausea/poor appetite we we will get CMP, CBC, TSH A1c lipid panel B12 and vitamin D along with HIV and hepatitis C screening.  Will also get CT scan of the lungs to assess for nodule/lung cancer and a PSA due to his family history of prostate cancer.  If all of this is negative consider abdominal imaging.  He also needs a colonoscopy.  Advised him we will discuss this at next visit.

## 2023-07-18 NOTE — Assessment & Plan Note (Signed)
Right lower extremity with most of his sensation loss and weakness below the knee.  Uncertain what caused it, but it has been present for the past year.  Had an MRI of the lumbar spine and brain that were normal.  Given his bandlike paresthesia around T4 it may benefit to get thoracic MRI at some point in the future.  Will get x-ray of the hip femur and knee to assess for structural causes.  Will check CMP, B12 folate vitamin D and TSH.  Will get nerve conduction studies prior to his upcoming appointment with neurology which is at least a month and a half away.

## 2023-07-18 NOTE — Assessment & Plan Note (Signed)
>>  ASSESSMENT AND PLAN FOR MONONEUROPATHY WRITTEN ON 07/18/2023  2:44 PM BY CHANDRA TORIBIO POUR, MD  Right lower extremity with most of his sensation loss and weakness below the knee.  Uncertain what caused it, but it has been present for the past year.  Had an MRI of the lumbar spine and brain that were normal.  Given his bandlike paresthesia around T4 it may benefit to get thoracic MRI at some point in the future.  Will get x-ray of the hip femur and knee to assess for structural causes.  Will check CMP, B12 folate vitamin D  and TSH.  Will get nerve conduction studies prior to his upcoming appointment with neurology which is at least a month and a half away.

## 2023-07-23 DIAGNOSIS — Z419 Encounter for procedure for purposes other than remedying health state, unspecified: Secondary | ICD-10-CM | POA: Diagnosis not present

## 2023-07-25 ENCOUNTER — Other Ambulatory Visit: Payer: Medicaid Other

## 2023-07-25 ENCOUNTER — Other Ambulatory Visit: Payer: Self-pay | Admitting: Family Medicine

## 2023-07-25 ENCOUNTER — Ambulatory Visit
Admission: RE | Admit: 2023-07-25 | Discharge: 2023-07-25 | Disposition: A | Discharge status: Home / Self Care | Payer: Medicaid Other | Source: Ambulatory Visit | Attending: Family Medicine

## 2023-07-25 ENCOUNTER — Ambulatory Visit
Admission: RE | Admit: 2023-07-25 | Discharge: 2023-07-25 | Disposition: A | Discharge status: Home / Self Care | Payer: Medicaid Other | Source: Ambulatory Visit | Attending: Family Medicine | Admitting: Family Medicine

## 2023-07-25 DIAGNOSIS — Z125 Encounter for screening for malignant neoplasm of prostate: Secondary | ICD-10-CM

## 2023-07-25 DIAGNOSIS — Z720 Tobacco use: Secondary | ICD-10-CM

## 2023-07-25 DIAGNOSIS — G589 Mononeuropathy, unspecified: Secondary | ICD-10-CM

## 2023-07-25 DIAGNOSIS — R634 Abnormal weight loss: Secondary | ICD-10-CM | POA: Diagnosis not present

## 2023-07-25 DIAGNOSIS — Z1159 Encounter for screening for other viral diseases: Secondary | ICD-10-CM

## 2023-07-25 DIAGNOSIS — Z9189 Other specified personal risk factors, not elsewhere classified: Secondary | ICD-10-CM

## 2023-07-25 DIAGNOSIS — Z114 Encounter for screening for human immunodeficiency virus [HIV]: Secondary | ICD-10-CM

## 2023-07-25 DIAGNOSIS — Z7689 Persons encountering health services in other specified circumstances: Secondary | ICD-10-CM

## 2023-07-26 ENCOUNTER — Other Ambulatory Visit: Payer: Self-pay | Admitting: Family Medicine

## 2023-07-26 ENCOUNTER — Encounter: Payer: Self-pay | Admitting: Family Medicine

## 2023-07-26 LAB — COMPREHENSIVE METABOLIC PANEL
ALT: 11 [IU]/L (ref 0–44)
AST: 18 [IU]/L (ref 0–40)
Albumin: 4.5 g/dL (ref 3.8–4.9)
Alkaline Phosphatase: 63 [IU]/L (ref 44–121)
BUN/Creatinine Ratio: 6 — ABNORMAL LOW (ref 9–20)
BUN: 5 mg/dL — ABNORMAL LOW (ref 6–24)
Bilirubin Total: 0.4 mg/dL (ref 0.0–1.2)
CO2: 26 mmol/L (ref 20–29)
Calcium: 9.8 mg/dL (ref 8.7–10.2)
Chloride: 102 mmol/L (ref 96–106)
Creatinine, Ser: 0.85 mg/dL (ref 0.76–1.27)
Globulin, Total: 2.4 g/dL (ref 1.5–4.5)
Glucose: 90 mg/dL (ref 70–99)
Potassium: 4.5 mmol/L (ref 3.5–5.2)
Sodium: 140 mmol/L (ref 134–144)
Total Protein: 6.9 g/dL (ref 6.0–8.5)
eGFR: 103 mL/min/{1.73_m2} (ref >59)

## 2023-07-26 LAB — CBC WITH DIFFERENTIAL/PLATELET
Basophils Absolute: 0 10*3/uL (ref 0.0–0.2)
Basos: 1 %
EOS (ABSOLUTE): 0.1 10*3/uL (ref 0.0–0.4)
Eos: 2 %
Hematocrit: 44.8 % (ref 37.5–51.0)
Hemoglobin: 14.7 g/dL (ref 13.0–17.7)
Immature Grans (Abs): 0 10*3/uL (ref 0.0–0.1)
Immature Granulocytes: 0 %
Lymphocytes Absolute: 1.6 10*3/uL (ref 0.7–3.1)
Lymphs: 28 %
MCH: 28.4 pg (ref 26.6–33.0)
MCHC: 32.8 g/dL (ref 31.5–35.7)
MCV: 87 fL (ref 79–97)
Monocytes Absolute: 0.4 10*3/uL (ref 0.1–0.9)
Monocytes: 8 %
Neutrophils Absolute: 3.5 10*3/uL (ref 1.4–7.0)
Neutrophils: 61 %
Platelets: 264 10*3/uL (ref 150–450)
RBC: 5.17 x10E6/uL (ref 4.14–5.80)
RDW: 13.2 % (ref 11.6–15.4)
WBC: 5.7 10*3/uL (ref 3.4–10.8)

## 2023-07-26 LAB — PSA: Prostate Specific Ag, Serum: 0.4 ng/mL (ref 0.0–4.0)

## 2023-07-26 LAB — B12 AND FOLATE PANEL
Folate: 9.5 ng/mL (ref >3.0)
Vitamin B-12: 176 pg/mL — ABNORMAL LOW (ref 232–1245)

## 2023-07-26 LAB — LIPID PANEL
Chol/HDL Ratio: 5.9 {ratio} — ABNORMAL HIGH (ref 0.0–5.0)
Cholesterol, Total: 219 mg/dL — ABNORMAL HIGH (ref 100–199)
HDL: 37 mg/dL — ABNORMAL LOW (ref >39)
LDL Chol Calc (NIH): 162 mg/dL — ABNORMAL HIGH (ref 0–99)
Triglycerides: 111 mg/dL (ref 0–149)
VLDL Cholesterol Cal: 20 mg/dL (ref 5–40)

## 2023-07-26 LAB — VITAMIN D 25 HYDROXY (VIT D DEFICIENCY, FRACTURES): Vit D, 25-Hydroxy: 13.6 ng/mL — ABNORMAL LOW (ref 30.0–100.0)

## 2023-07-26 LAB — HEMOGLOBIN A1C
Est. average glucose Bld gHb Est-mCnc: 108 mg/dL
Hgb A1c MFr Bld: 5.4 % (ref 4.8–5.6)

## 2023-07-26 LAB — HIV ANTIBODY (ROUTINE TESTING W REFLEX): HIV Screen 4th Generation wRfx: NONREACTIVE

## 2023-07-26 LAB — TSH: TSH: 3.43 u[IU]/mL (ref 0.450–4.500)

## 2023-07-26 LAB — HEPATITIS C ANTIBODY: Hep C Virus Ab: NONREACTIVE

## 2023-07-26 MED ORDER — CYANOCOBALAMIN 5000 MCG PO TBDP: 1.0000 | ORAL_TABLET | Freq: Every day | ORAL | 3 refills | Status: AC

## 2023-07-26 MED ORDER — VITAMIN D (ERGOCALCIFEROL) 1.25 MG (50000 UNIT) PO CAPS: 50000.0000 [IU] | ORAL_CAPSULE | ORAL | 0 refills | Status: DC

## 2023-07-29 ENCOUNTER — Encounter: Payer: Self-pay | Admitting: Family Medicine

## 2023-08-01 ENCOUNTER — Encounter: Payer: Self-pay | Admitting: Family Medicine

## 2023-08-12 ENCOUNTER — Other Ambulatory Visit: Payer: Medicaid Other

## 2023-08-18 ENCOUNTER — Ambulatory Visit (INDEPENDENT_AMBULATORY_CARE_PROVIDER_SITE_OTHER): Payer: Medicaid Other | Admitting: Family Medicine

## 2023-08-18 ENCOUNTER — Encounter: Payer: Self-pay | Admitting: Family Medicine

## 2023-08-18 VITALS — BP 115/71 | HR 63 | Ht 66.0 in | Wt 158.0 lb

## 2023-08-18 DIAGNOSIS — Z1211 Encounter for screening for malignant neoplasm of colon: Secondary | ICD-10-CM | POA: Diagnosis not present

## 2023-08-18 DIAGNOSIS — R634 Abnormal weight loss: Secondary | ICD-10-CM | POA: Diagnosis not present

## 2023-08-18 DIAGNOSIS — E782 Mixed hyperlipidemia: Secondary | ICD-10-CM

## 2023-08-18 DIAGNOSIS — B356 Tinea cruris: Secondary | ICD-10-CM | POA: Diagnosis not present

## 2023-08-18 DIAGNOSIS — R202 Paresthesia of skin: Secondary | ICD-10-CM | POA: Diagnosis not present

## 2023-08-18 DIAGNOSIS — G589 Mononeuropathy, unspecified: Secondary | ICD-10-CM

## 2023-08-18 DIAGNOSIS — Z1212 Encounter for screening for malignant neoplasm of rectum: Secondary | ICD-10-CM

## 2023-08-18 MED ORDER — ROSUVASTATIN CALCIUM 5 MG PO TABS: 5.0000 mg | ORAL_TABLET | Freq: Every day | ORAL | 3 refills | Status: DC

## 2023-08-18 MED ORDER — TRIAMCINOLONE ACETONIDE 0.1 % EX CREA: TOPICAL_CREAM | Freq: Two times a day (BID) | CUTANEOUS | 1 refills | Status: AC

## 2023-08-18 NOTE — Assessment & Plan Note (Signed)
 Patient's LDL was greater than 160 and he has risk factors of smoking and family history of TIAs.  Patient agreeable to starting statin today.  Will start rosuvastatin 5 mg.  Recheck in 2 months.

## 2023-08-18 NOTE — Patient Instructions (Addendum)
 It was nice to see you today,  We addressed the following topics today: -I have sent in your rosuvastatin.  There is some information regarding his medication in this handout.  Common side effects include muscle pain, aches.  Let us know if you experience any of this.  Let us know if you experience any abdominal pain with this. - In 2 months we will recheck your B12, vitamin D and cholesterol levels. - I am sending in an x-ray order for your spine due to the paresthesia you are experiencing.  You can get this done at any time at [facility] - I am sending in an ultrasound of your abdomen due to your weight loss and poor appetite.  Someone will call you to schedule this. - Your neurologist can decide what further testing is needed for your neuropathy when you see them in a few weeks. - I am sending in a referral for a colonoscopy.  This will likely be to a new provider.  Someone should call you in the next few weeks to schedule it. - I have sent in a cream that includes an antifungal and a steroid cream.  The steroid component can help with itching and the antifungal can help with any fungal infection.  Use this twice a day for at least 2 weeks on the affected area.  Continue for 7 days after your itching stops.  You can then switch to twice weekly use to prevent it from coming back.  Have a great day,  Frederic Jericho, MD

## 2023-08-18 NOTE — Assessment & Plan Note (Signed)
>>  ASSESSMENT AND PLAN FOR MONONEUROPATHY WRITTEN ON 08/18/2023  9:34 AM BY CHANDRA TORIBIO POUR, MD  Has upcoming appointment with neurology in a few weeks.  B12 was low and he is currently taking supplement but I do not think this explains the neuropathy and weakness by itself considering it is only affecting his right leg.  Ordered a nerve conduction study to be done prior to his neurology visit at our last visit but this was not set up since it was not ordered as a PM&R referral.  At this point we will hold off until his neurologist orders the appropriate testing.

## 2023-08-18 NOTE — Progress Notes (Signed)
 Established Patient Office Visit  Subjective   Patient ID: Alex Turner, male    DOB: [birth year]  Age: 53 y.o. MRN: [id number]  Chief Complaint  Patient presents with   Medical Management of Chronic Issues    HPI  Here today for follow-up of his initial visit last month.  Neuropathy-patient is taking his B12 supplement.  Discussed previous imaging findings including his lumbar MRI, and his x-rays of the hip and knee, the findings suggest arthritis and osteophytes in the knee.  Patient has upcoming appointment with neurology in less than 2 weeks.  Weight loss/poor appetite-patient agreeable to getting colonoscopy again, was due for 1 5 years ago.  Patient agreeable to ultrasound of the abdomen.  He states he can sometimes "go a week" without eating.  Patient clarified that the timeframe for his weight loss may have been closer to 2020 but still had sudden unexplained significant weight loss.  Rarely gets nausea or vomiting.  We discussed patient's cholesterol levels, reasons to start a cholesterol medication including his risk factors of smoking and family history of high cholesterol.  Patient's mother and grandfather have had TIAs but no strokes or MIs.  Patient agreeable to starting a statin.  Patient states that the burning sensation that wraps around his chest returned.  Thinks it may have occurred after he turned his neck a certain way and tweaked it.  Patient complaining of itch in his groin.  This has been going on for a few years at least.  Last sexually active over 10 years ago.  Has tried over-the-counter antifungal treatments without improvement.  Has some small bumps on the left side of his groin.  He has a history of ingrown hairs or inflamed cysts in his armpits, a history of bursitis in the elbow states he has had to have some of them surgically removed.   The ASCVD Risk score (Arnett DK, et al., 2019) failed to calculate for the following reasons:   Risk score cannot be  calculated because patient has a medical history suggesting prior/existing ASCVD  Health Maintenance Due  Topic Date Due   Pneumococcal Vaccine 53-39 Years old (1 of 2 - PCV) Never done   FOOT EXAM  Never done   OPHTHALMOLOGY EXAM  Never done   Diabetic kidney evaluation - Urine ACR  Never done   DTaP/Tdap/Td (1 - Tdap) Never done   Lung Cancer Screening  Never done   Zoster Vaccines- Shingrix (1 of 2) Never done   COVID-19 Vaccine (1 - 2024-25 season) Never done      Objective:     BP 115/71   Pulse 63   Ht 5\' 6"  (1.676 m)   Wt 158 lb (71.7 kg)   SpO2 97%   BMI 25.50 kg/m    Physical Exam General: Alert, oriented GU: Two 5 mm pearly papules in the left inguinal region at the border of the scrotum.  Otherwise normal exam.   No results found for any visits on 08/18/23.      Assessment & Plan:   Mononeuropathy Assessment & Plan: Has upcoming appointment with neurology in a few weeks.  B12 was low and he is currently taking supplement but I do not think this explains the neuropathy and weakness by itself considering it is only affecting his right leg.  Ordered a nerve conduction study to be done prior to his neurology visit at our last visit but this was not set up since it was not ordered as a  PM&R referral.  At this point we will hold off until his neurologist orders the appropriate testing.   Encounter for colorectal cancer screening -     Ambulatory referral to Gastroenterology  Unintentional weight loss Assessment & Plan: Blood testing was unrevealing for causes of his poor appetite/weight loss.  Will get abdominal ultrasound.  Also ordering screening colonoscopy because he is due for this because of his family history.  Last colonoscopy was 2015.  Orders: -     US Abdomen Complete; Future  Paresthesia Assessment & Plan: Patient states the bandlike paresthesia at the level of T4-T5 returned.  Believes this was after he turned his neck a certain way that it  would sit at pain in the symptoms.  Will get cervical and thoracic x-rays.  Orders: -     DG Cervical Spine Complete; Future -     DG Thoracic Spine 2 View; Future  Mixed hyperlipidemia Assessment & Plan: Patient's LDL was greater than 160 and he has risk factors of smoking and family history of TIAs.  Patient agreeable to starting statin today.  Will start rosuvastatin 5 mg.  Recheck in 2 months.   Tinea cruris Assessment & Plan: No obvious rash seen but patient complaining of itching in the groin and exam was normal except for some smooth papules on that left side that seem more consistent with sebaceous cyst than HPV.  Prescribed antifungal with steroid to use twice a day for the next 2 weeks.  If this does not improve his symptoms we will need to decide on further workup and treatment.  Consider dermatology referral   Other orders -     Rosuvastatin Calcium; Take 1 tablet (5 mg total) by mouth daily.  Dispense: 90 tablet; Refill: 3 -     ketoconazole 2%-triamcinolone 0.1% 1:2 cream mixture; Apply topically 2 (two) times daily.  Dispense: 45 g; Refill: 1    I spent 33 minutes in the management of this patient.  Return in about 2 months (around 10/16/2023).    Sandre Kitty, MD

## 2023-08-18 NOTE — Assessment & Plan Note (Signed)
 No obvious rash seen but patient complaining of itching in the groin and exam was normal except for some smooth papules on that left side that seem more consistent with sebaceous cyst than HPV.  Prescribed antifungal with steroid to use twice a day for the next 2 weeks.  If this does not improve his symptoms we will need to decide on further workup and treatment.  Consider dermatology referral

## 2023-08-18 NOTE — Assessment & Plan Note (Signed)
 Has upcoming appointment with neurology in a few weeks.  B12 was low and he is currently taking supplement but I do not think this explains the neuropathy and weakness by itself considering it is only affecting his right leg.  Ordered a nerve conduction study to be done prior to his neurology visit at our last visit but this was not set up since it was not ordered as a PM&R referral.  At this point we will hold off until his neurologist orders the appropriate testing.

## 2023-08-18 NOTE — Assessment & Plan Note (Signed)
 Blood testing was unrevealing for causes of his poor appetite/weight loss.  Will get abdominal ultrasound.  Also ordering screening colonoscopy because he is due for this because of his family history.  Last colonoscopy was 2015.

## 2023-08-18 NOTE — Assessment & Plan Note (Signed)
 Patient states the bandlike paresthesia at the level of T4-T5 returned.  Believes this was after he turned his neck a certain way that it would sit at pain in the symptoms.  Will get cervical and thoracic x-rays.

## 2023-08-20 DIAGNOSIS — Z419 Encounter for procedure for purposes other than remedying health state, unspecified: Secondary | ICD-10-CM | POA: Diagnosis not present

## 2023-08-22 ENCOUNTER — Ambulatory Visit
Admission: RE | Admit: 2023-08-22 | Discharge: 2023-08-22 | Disposition: A | Discharge status: Home / Self Care | Source: Ambulatory Visit | Attending: Family Medicine | Admitting: Family Medicine

## 2023-08-22 ENCOUNTER — Ambulatory Visit
Admission: RE | Admit: 2023-08-22 | Discharge: 2023-08-22 | Disposition: A | Discharge status: Home / Self Care | Payer: Medicaid Other | Source: Ambulatory Visit | Attending: Family Medicine | Admitting: Family Medicine

## 2023-08-22 DIAGNOSIS — R634 Abnormal weight loss: Secondary | ICD-10-CM

## 2023-08-22 DIAGNOSIS — R202 Paresthesia of skin: Secondary | ICD-10-CM

## 2023-08-23 ENCOUNTER — Encounter: Payer: Self-pay | Admitting: Family Medicine

## 2023-08-26 ENCOUNTER — Encounter: Payer: Self-pay | Admitting: Neurology

## 2023-08-26 ENCOUNTER — Ambulatory Visit: Payer: Medicaid Other | Admitting: Neurology

## 2023-08-26 VITALS — BP 128/50 | HR 68 | Ht 66.0 in | Wt 151.0 lb

## 2023-08-26 DIAGNOSIS — G5702 Lesion of sciatic nerve, left lower limb: Secondary | ICD-10-CM | POA: Diagnosis not present
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
## 2023-08-26 NOTE — Patient Instructions (Addendum)
 Common Peroneal Neuropathy  - Send you to Dr. Bearl Mulberry and he is a peripheral nerve surgeon/neurosurgeon - Our EMG/NCS are running a few months behind so I don;t want to wait to send yo to the surgeon because he can perform ultrasound on the leg to see if he can find an entrapment - I Will refer to Dr. Bearl Mulberry and see if he would like an emg/ncs because exam very much points to the left peroneal nerve at the left lateral knee (fibular head)    Common Peroneal Nerve Entrapment  Common peroneal nerve entrapment is a condition that can make it hard to lift a foot. The condition results from pressure on a nerve in the lower leg called the common peroneal nerve. Your common peroneal nerve provides feeling to your outer lower leg and foot. It also supplies the muscles that move your foot and toes upward and outward. What are the causes? This condition may be caused by: Sitting cross-legged, squatting, or kneeling for long periods of time. A hard, direct hit to the outside of the lower leg. Swelling from a knee injury. A break or fracture in one of the lower leg bones. Wearing a boot or cast that ends just below the knee. A growth or cyst near the nerve. What increases the risk? This condition is more likely to develop in people who play: Contact sports, such as football or hockey. Sports where you wear high and stiff boots, such as skiing. What are the signs or symptoms? Symptoms of this condition include: Trouble lifting your foot up (foot drop). Tripping often. Your foot hitting the ground harder than normal as you walk, resulting in a slapping-type sound. Numbness, tingling, or pain in the outside of the knee, outside of the lower leg, and top of the foot. Sensitivity to pressure on the front or outside of the leg. How is this diagnosed? This condition may be diagnosed based on: Your symptoms. Your medical history. A physical exam. Tests, such as: X-rays to check the bones  of your knee and leg. MRI to check tendons that attach to the side of your knee. Ultrasound to check for a growth or cyst. Electromyogram (EMG) to check your nerves. During your physical exam, your health care provider will check for numbness in your leg and test the strength of your lower leg muscles. He or she may tap the side of your lower leg to see if that causes tingling. How is this treated? Treatment for this condition may include: Avoiding activities that make symptoms worse. Using a brace to hold up your foot and toes. Taking anti-inflammatory pain medicines to relieve swelling and lessen pain. Having medicines injected into your ankle joint to lessen pain and swelling. Doing exercises to help you regain or maintain movement (physical therapy). Surgery to take pressure off the nerve. This may be needed if there is no improvement after 2-3 months or if there is a growth pushing on the nerve. Returning gradually to full activity. Follow these instructions at home: If you have a removable brace: Wear the brace as told by your health care provider. Remove it only as told by your health care provider. Check the skin around the brace every day. Tell your health care provider about any concerns. Loosen the brace if your toes tingle, become numb, or turn cold and blue. Keep the brace clean. If the brace is not waterproof: Do not let it get wet. Cover it with a watertight covering when you take  a bath or a shower. Activity Ask your health care provider when it is safe to drive if you have a brace on your foot. Do not do any activities that make pain or swelling worse. Do exercises as told by your health care provider. Return to your normal activities as told by your health care provider. Ask your health care provider what activities are safe for you. General instructions Take over-the-counter and prescription medicines only as told by your health care provider. Do not put your full  weight on your knee until your health care provider says you can. Use crutches as directed by your health care provider. Keep all follow-up visits. This is important. How is this prevented? Wear supportive footwear that is appropriate for your athletic activity. Avoid athletic activities that cause ankle pain or swelling. Wear protective padding over your lower legs when playing contact sports. Make sure your boots do not put extra pressure on the area just below your knees. Do not sit cross-legged for long periods of time. Contact a health care provider if: Your symptoms do not get better in 2-3 months. The weakness or numbness in your leg or foot gets worse. Summary Common peroneal nerve entrapment is a condition that results from pressure on a nerve in the lower leg called the common peroneal nerve. This condition may be caused by a hard hit, swelling, a fracture, or a cyst in the lower leg. Treatment may include rest, a brace, medicines, and physical therapy. Sometimes surgery is needed. Do not do any activities that make pain or swelling worse. This information is not intended to replace advice given to you by your health care provider. Make sure you discuss any questions you have with your health care provider. Document Revised: 02/18/2021 Document Reviewed: 02/18/2021 Elsevier Patient Education  2024 [employer].

## 2023-08-28 ENCOUNTER — Encounter: Payer: Self-pay | Admitting: Neurology

## 2023-08-28 DIAGNOSIS — G5702 Lesion of sciatic nerve, left lower limb: Secondary | ICD-10-CM | POA: Insufficient documentation

## 2023-09-05 ENCOUNTER — Encounter: Payer: Self-pay | Admitting: Neurosurgery

## 2023-09-05 ENCOUNTER — Ambulatory Visit (INDEPENDENT_AMBULATORY_CARE_PROVIDER_SITE_OTHER): Admitting: Neurosurgery

## 2023-09-05 VITALS — BP 128/82 | Ht 66.0 in | Wt 149.0 lb

## 2023-09-05 DIAGNOSIS — R29898 Other symptoms and signs involving the musculoskeletal system: Secondary | ICD-10-CM | POA: Diagnosis not present
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
## 2023-09-07 ENCOUNTER — Other Ambulatory Visit: Payer: Medicaid Other

## 2023-09-11 ENCOUNTER — Other Ambulatory Visit: Payer: Self-pay | Admitting: Neurology

## 2023-09-11 DIAGNOSIS — G5730 Lesion of lateral popliteal nerve, unspecified lower limb: Secondary | ICD-10-CM

## 2023-09-12 ENCOUNTER — Telehealth: Payer: Self-pay | Admitting: Neurology

## 2023-09-12 NOTE — Telephone Encounter (Signed)
 Called to schedule NCV/EMG, patient scheduled with Dr. Lucia Gaskins for 10/27/23 at 11am and appointment has been added to wait list. He wanted me to relay that the numbness is now going up his right leg and into his hip

## 2023-09-15 DIAGNOSIS — M6281 Muscle weakness (generalized): Secondary | ICD-10-CM | POA: Diagnosis not present

## 2023-09-15 DIAGNOSIS — R2689 Other abnormalities of gait and mobility: Secondary | ICD-10-CM | POA: Diagnosis not present

## 2023-09-20 DIAGNOSIS — M6281 Muscle weakness (generalized): Secondary | ICD-10-CM | POA: Diagnosis not present

## 2023-09-20 DIAGNOSIS — R2689 Other abnormalities of gait and mobility: Secondary | ICD-10-CM | POA: Diagnosis not present

## 2023-09-21 ENCOUNTER — Telehealth: Payer: Self-pay | Admitting: Neurology

## 2023-09-21 ENCOUNTER — Ambulatory Visit
Admission: RE | Admit: 2023-09-21 | Discharge: 2023-09-21 | Disposition: A | Discharge status: Home / Self Care | Source: Ambulatory Visit | Attending: Family Medicine | Admitting: Family Medicine

## 2023-09-21 ENCOUNTER — Other Ambulatory Visit: Payer: Self-pay | Admitting: Family Medicine

## 2023-09-21 ENCOUNTER — Telehealth: Payer: Self-pay | Admitting: Neurosurgery

## 2023-09-21 DIAGNOSIS — Z720 Tobacco use: Secondary | ICD-10-CM

## 2023-09-21 DIAGNOSIS — Z122 Encounter for screening for malignant neoplasm of respiratory organs: Secondary | ICD-10-CM | POA: Diagnosis not present

## 2023-09-21 DIAGNOSIS — Z7689 Persons encountering health services in other specified circumstances: Secondary | ICD-10-CM | POA: Diagnosis not present

## 2023-09-21 DIAGNOSIS — Z87891 Personal history of nicotine dependence: Secondary | ICD-10-CM | POA: Diagnosis not present

## 2023-09-21 NOTE — Telephone Encounter (Signed)
 I called the pt back. He said he saw Dr Katrinka Blazing who ordered PT. Patient thinks PT is making it worse. He has increase in numbness and it is harder to walk for a few days after PT then the symptoms go back down to his "norm" for now. He also mentioned that his numbness is now above his knee. I asked for the patient to call Dr Michaelle Copas office and let him know because he may have other things that he recommends based on how pt responds to PT. I told him I would let Dr Lucia Gaskins know. The patient verbalized understanding and thanked me for the call.

## 2023-09-21 NOTE — Telephone Encounter (Signed)
 I'll see if I can get him a sooner emg/ncs. I did get him evaluated by peripheral nerve surgeon Dr. Ernestine Mcmurray. (Fyi dr Antoine Primas)

## 2023-09-21 NOTE — Telephone Encounter (Signed)
 Pt called stating that he is in PT and he has noticed that its actually making the numbness worse than before. Pt would like to discuss what other options he has. Please advise.

## 2023-09-21 NOTE — Telephone Encounter (Signed)
 Patient is calling that he has been to [facility] 2 times. His last visit was yesterday 09/20/2023. He states that the numbness is worse and it's harder for him to walk after the visit. He did mention his symptoms to the therapist yesterday. The note has not been faxed over from them yet. He has another PT appt tomorrow at 3:30pm. He is wondering if he should continue the visits? He is aware that Dr.Smith is not in the office today.

## 2023-09-22 DIAGNOSIS — M6281 Muscle weakness (generalized): Secondary | ICD-10-CM | POA: Diagnosis not present

## 2023-09-22 DIAGNOSIS — R2689 Other abnormalities of gait and mobility: Secondary | ICD-10-CM | POA: Diagnosis not present

## 2023-09-26 DIAGNOSIS — M6281 Muscle weakness (generalized): Secondary | ICD-10-CM | POA: Diagnosis not present

## 2023-09-26 DIAGNOSIS — R2689 Other abnormalities of gait and mobility: Secondary | ICD-10-CM | POA: Diagnosis not present

## 2023-09-27 NOTE — Telephone Encounter (Signed)
 I called Mr Alex Turner. He is having increasing pain due to his participation in PT. He had PT yesterday and states having difficulty walking after his session. Resting since his PT session has not helped and is still having difficulty walking.   He states that he is having new bilateral numbness that originally was lower leg ending at the knee but now is expanded to the thigh. He denies any bowel or bladder incontinence but mentions difficulty initiating urination. He denies saddle anesthesia at this time.   He says his pain was mostly on the right back/ hip but now is radiating into the left side also. Feels tingling in the hips and leg is also migrated to the left side.   Patient addresses fear of long term dysfunction due to worsening symptoms. I reassure him that we will see him tomorrow for further evaluation.   Due to his new severity of symptoms, I added him to Dr. Michaelle Copas schedule for tomorrow for further evaluation. Ok per Dr. Katrinka Blazing

## 2023-09-28 ENCOUNTER — Ambulatory Visit (INDEPENDENT_AMBULATORY_CARE_PROVIDER_SITE_OTHER): Admitting: Neurosurgery

## 2023-09-28 ENCOUNTER — Encounter: Payer: Self-pay | Admitting: Neurosurgery

## 2023-09-28 ENCOUNTER — Observation Stay
Admission: EM | Admit: 2023-09-28 | Discharge: 2023-09-30 | Disposition: A | Discharge status: Home / Self Care | Attending: Internal Medicine | Admitting: Internal Medicine

## 2023-09-28 ENCOUNTER — Emergency Department

## 2023-09-28 ENCOUNTER — Other Ambulatory Visit: Payer: Self-pay

## 2023-09-28 VITALS — BP 136/72 | Ht 66.0 in | Wt 149.0 lb

## 2023-09-28 DIAGNOSIS — G992 Myelopathy in diseases classified elsewhere: Secondary | ICD-10-CM | POA: Diagnosis not present

## 2023-09-28 DIAGNOSIS — R269 Unspecified abnormalities of gait and mobility: Secondary | ICD-10-CM | POA: Insufficient documentation

## 2023-09-28 DIAGNOSIS — R531 Weakness: Secondary | ICD-10-CM | POA: Diagnosis not present

## 2023-09-28 DIAGNOSIS — F418 Other specified anxiety disorders: Secondary | ICD-10-CM | POA: Diagnosis not present

## 2023-09-28 DIAGNOSIS — R29898 Other symptoms and signs involving the musculoskeletal system: Secondary | ICD-10-CM | POA: Insufficient documentation

## 2023-09-28 DIAGNOSIS — F329 Major depressive disorder, single episode, unspecified: Secondary | ICD-10-CM | POA: Insufficient documentation

## 2023-09-28 DIAGNOSIS — R200 Anesthesia of skin: Secondary | ICD-10-CM

## 2023-09-28 DIAGNOSIS — R202 Paresthesia of skin: Secondary | ICD-10-CM | POA: Diagnosis not present

## 2023-09-28 DIAGNOSIS — E785 Hyperlipidemia, unspecified: Secondary | ICD-10-CM

## 2023-09-28 DIAGNOSIS — R001 Bradycardia, unspecified: Secondary | ICD-10-CM | POA: Diagnosis not present

## 2023-09-28 DIAGNOSIS — Z23 Encounter for immunization: Secondary | ICD-10-CM | POA: Insufficient documentation

## 2023-09-28 DIAGNOSIS — G959 Disease of spinal cord, unspecified: Secondary | ICD-10-CM | POA: Diagnosis not present

## 2023-09-28 DIAGNOSIS — M4802 Spinal stenosis, cervical region: Secondary | ICD-10-CM

## 2023-09-28 DIAGNOSIS — E559 Vitamin D deficiency, unspecified: Secondary | ICD-10-CM | POA: Diagnosis not present

## 2023-09-28 DIAGNOSIS — E538 Deficiency of other specified B group vitamins: Secondary | ICD-10-CM

## 2023-09-28 DIAGNOSIS — F250 Schizoaffective disorder, bipolar type: Secondary | ICD-10-CM

## 2023-09-28 DIAGNOSIS — F17210 Nicotine dependence, cigarettes, uncomplicated: Secondary | ICD-10-CM | POA: Diagnosis not present

## 2023-09-28 DIAGNOSIS — E119 Type 2 diabetes mellitus without complications: Secondary | ICD-10-CM

## 2023-09-28 DIAGNOSIS — Z720 Tobacco use: Secondary | ICD-10-CM

## 2023-09-28 DIAGNOSIS — D519 Vitamin B12 deficiency anemia, unspecified: Secondary | ICD-10-CM | POA: Diagnosis not present

## 2023-09-28 LAB — COMPREHENSIVE METABOLIC PANEL WITH GFR
ALT: 10 U/L (ref 0–44)
AST: 14 U/L — ABNORMAL LOW (ref 15–41)
Albumin: 4.3 g/dL (ref 3.5–5.0)
Alkaline Phosphatase: 48 U/L (ref 38–126)
Anion gap: 8 (ref 5–15)
BUN: 11 mg/dL (ref 6–20)
CO2: 24 mmol/L (ref 22–32)
Calcium: 9 mg/dL (ref 8.9–10.3)
Chloride: 105 mmol/L (ref 98–111)
Creatinine, Ser: 0.69 mg/dL (ref 0.61–1.24)
GFR, Estimated: >60 mL/min (ref >60)
Glucose, Bld: 89 mg/dL (ref 70–99)
Potassium: 4 mmol/L (ref 3.5–5.1)
Sodium: 137 mmol/L (ref 135–145)
Total Bilirubin: 0.5 mg/dL (ref 0.0–1.2)
Total Protein: 7.4 g/dL (ref 6.5–8.1)

## 2023-09-28 LAB — CBC WITH DIFFERENTIAL/PLATELET
Abs Immature Granulocytes: 0.03 10*3/uL (ref 0.00–0.07)
Basophils Absolute: 0.1 10*3/uL (ref 0.0–0.1)
Basophils Relative: 1 %
Eosinophils Absolute: 0.1 10*3/uL (ref 0.0–0.5)
Eosinophils Relative: 1 %
HCT: 45.2 % (ref 39.0–52.0)
Hemoglobin: 15.1 g/dL (ref 13.0–17.0)
Immature Granulocytes: 0 %
Lymphocytes Relative: 36 %
Lymphs Abs: 2.4 10*3/uL (ref 0.7–4.0)
MCH: 28.8 pg (ref 26.0–34.0)
MCHC: 33.4 g/dL (ref 30.0–36.0)
MCV: 86.3 fL (ref 80.0–100.0)
Monocytes Absolute: 0.5 10*3/uL (ref 0.1–1.0)
Monocytes Relative: 7 %
Neutro Abs: 3.7 10*3/uL (ref 1.7–7.7)
Neutrophils Relative %: 55 %
Platelets: 241 10*3/uL (ref 150–400)
RBC: 5.24 MIL/uL (ref 4.22–5.81)
RDW: 12.9 % (ref 11.5–15.5)
WBC: 6.7 10*3/uL (ref 4.0–10.5)
nRBC: 0.4 % — ABNORMAL HIGH (ref 0.0–0.2)

## 2023-09-28 LAB — TYPE AND SCREEN
ABO/RH(D): A POS
Antibody Screen: NEGATIVE

## 2023-09-28 LAB — APTT: aPTT: 30 s (ref 24–36)

## 2023-09-28 LAB — PROTIME-INR
INR: 1 (ref 0.8–1.2)
Prothrombin Time: 13.5 s (ref 11.4–15.2)

## 2023-09-28 MED ORDER — ROSUVASTATIN CALCIUM 5 MG PO TABS
5.0000 mg | ORAL_TABLET | Freq: Every day | ORAL | Status: DC
Start: 2023-09-29 — End: 2023-09-29
  Administered 2023-09-29: 5 mg via ORAL
  Filled 2023-09-28: qty 1

## 2023-09-28 MED ORDER — ONDANSETRON HCL 4 MG/2ML IJ SOLN: 4.0000 mg | Freq: Three times a day (TID) | INTRAMUSCULAR | Status: DC | PRN

## 2023-09-28 MED ORDER — VITAMIN B-12 1000 MCG PO TABS
5000.0000 ug | ORAL_TABLET | Freq: Every day | ORAL | Status: DC
  Administered 2023-09-29 – 2023-09-30 (×2): 5000 ug via ORAL
  Filled 2023-09-28 (×2): qty 5

## 2023-09-28 MED ORDER — OXYCODONE-ACETAMINOPHEN 5-325 MG PO TABS: 1.0000 | ORAL_TABLET | ORAL | Status: DC | PRN

## 2023-09-28 MED ORDER — ACETAMINOPHEN 325 MG PO TABS: 650.0000 mg | ORAL_TABLET | Freq: Four times a day (QID) | ORAL | Status: DC | PRN

## 2023-09-28 MED ORDER — METHOCARBAMOL 500 MG PO TABS
500.0000 mg | ORAL_TABLET | Freq: Three times a day (TID) | ORAL | Status: DC | PRN
Start: 2023-09-28 — End: 2023-09-30

## 2023-09-28 MED ORDER — HALOPERIDOL LACTATE 5 MG/ML IJ SOLN: 2.0000 mg | Freq: Three times a day (TID) | INTRAMUSCULAR | Status: DC | PRN

## 2023-09-28 MED ORDER — LORAZEPAM 2 MG/ML IJ SOLN
2.0000 mg | Freq: Four times a day (QID) | INTRAMUSCULAR | Status: DC | PRN
  Administered 2023-09-29 (×2): 2 mg via INTRAVENOUS
  Filled 2023-09-28 (×2): qty 1

## 2023-09-28 MED ORDER — NICOTINE 21 MG/24HR TD PT24
21.0000 mg | MEDICATED_PATCH | Freq: Every day | TRANSDERMAL | Status: DC
  Administered 2023-09-28 – 2023-09-30 (×4): 21 mg via TRANSDERMAL
  Filled 2023-09-28 (×4): qty 1

## 2023-09-28 NOTE — ED Triage Notes (Signed)
 Pt sent by neurosurgeon for spinal MRI from c-spine to t-spine. Pt has had tingling/numbness from his R knee down to his foot. He underwent PT and the sx worsened and now he has to use a cane to walk. Pt has had MRI of L-spine that shows bulging discs in L1-L4 with bone spurs and collapse of L5. Dr. Katrinka Blazing is who he follows with.
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
## 2023-09-28 NOTE — ED Notes (Signed)
 Patient provided fluids and blanket

## 2023-09-28 NOTE — Telephone Encounter (Signed)
 Yes offer him May 1st at 8am I have a botox at 8am but will do it quickly so you can double book thanks!

## 2023-09-28 NOTE — ED Notes (Signed)
Pt resting comfortably in bed at this time. Pt is alert and oriented with even and regular respirations. No acute distress noted. Pt denies any needs at this time. Call light within reach.  °

## 2023-09-28 NOTE — Progress Notes (Signed)
 Patient was sent to the emergency room from clinic today.  He has progressive loss of ambulatory function, his weakness has spread from his distal right lower extremity to proximal and now spreading to the left side.  He feels a bandlike sensation across his chest intermittently.  He has developed hyperreflexia in the bilateral lower extremities.  I am concerned for myelopathy, sending him to the emergency department to evaluate with a MRI of the cervical and thoracic spine.  I reached out to the charge nurse to update them.  A full note will follow from his clinical evaluation here in clinic today.

## 2023-09-28 NOTE — ED Notes (Signed)
 Pt given lunch box. No other needs identified at this time.
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
## 2023-09-28 NOTE — ED Provider Notes (Signed)
 [facility] Provider Note    Event Date/Time   First MD Initiated Contact with Patient 09/28/23 1506     (approximate)   History   Numbness   HPI  Alex Turner is a 53 y.o. male with a history of PTSD, schizoaffective disorder and TIA presents to the ER for evaluation of worsening weakness and hyperreflexia.  Patient was seen in neurosurgery clinic today and given the acuity of his symptoms was sent to the ER for emergent MRI imaging.     Physical Exam   Triage Vital Signs: ED Triage Vitals  Encounter Vitals Group     BP 09/28/23 1135 (!) 143/93     Systolic BP Percentile --      Diastolic BP Percentile --      Pulse Rate 09/28/23 1134 62     Resp 09/28/23 1134 15     Temp 09/28/23 1134 98.1 F (36.7 C)     Temp Source 09/28/23 1134 Oral     SpO2 09/28/23 1134 94 %     Weight 09/28/23 1137 149 lb 0.5 oz (67.6 kg)     Height 09/28/23 1137 5\' 6"  (1.676 m)     Head Circumference --      Peak Flow --      Pain Score 09/28/23 1137 4     Pain Loc --      Pain Education --      Exclude from Growth Chart --     Most recent vital signs: Vitals:   09/28/23 1135 09/28/23 1507  BP: (!) 143/93 129/85  Pulse:  (!) 58  Resp:  16  Temp:  97.9 F (36.6 C)  SpO2:  98%     Constitutional: Alert  Eyes: Conjunctivae are normal.  Head: Atraumatic. Nose: No congestion/rhinnorhea. Mouth/Throat: Mucous membranes are moist.   Neck: Painless ROM.  Cardiovascular:   Good peripheral circulation. Respiratory: Normal respiratory effort.  No retractions.  Gastrointestinal: Soft and nontender.  Musculoskeletal:  no deformity Neurologic:  MAE spontaneously.     ED Results / Procedures / Treatments   Labs (all labs ordered are listed, but only abnormal results are displayed) Labs Reviewed  CBC WITH DIFFERENTIAL/PLATELET - Abnormal; Notable for the following components:      Result Value   nRBC 0.4 (*)    All other components within normal limits   COMPREHENSIVE METABOLIC PANEL WITH GFR - Abnormal; Notable for the following components:   AST 14 (*)    All other components within normal limits     EKG     RADIOLOGY Please see ED Course for my review and interpretation.  I personally reviewed all radiographic images ordered to evaluate for the above acute complaints and reviewed radiology reports and findings.  These findings were personally discussed with the patient.  Please see medical record for radiology report.    PROCEDURES:   Procedures   MEDICATIONS ORDERED IN ED: Medications - No data to display   IMPRESSION / MDM / ASSESSMENT AND PLAN / ED COURSE  I reviewed the triage vital signs and the nursing notes.                              Differential diagnosis includes, but is not limited to,  cs, claudication, tia, electrolyte abn, gbs  Patient presenting to the ER for evaluation of symptoms as described above.  Based on symptoms, risk factors and considered above  differential, this presenting complaint could reflect a potentially life-threatening illness therefore the patient will be placed on continuous pulse oximetry and telemetry for monitoring.  Laboratory evaluation will be sent to evaluate for the above complaints.  MRI will be ordered as per Neurosugery recommendations.  I have a lower suspicion for CVA or TIA.    Clinical Course as of 09/28/23 1857  Wed Sep 28, 2023  9629 MRI thoracic spine on my review and interpretation without any significant disease but does have some stenosis at C5-C6.  Will discuss in consultation with neurosurgery. 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 [PR]    Clinical Course User Index [PR] Willy Eddy, MD     FINAL CLINICAL IMPRESSION(S) / ED DIAGNOSES   Final diagnoses:  Cervical myelopathy  (HCC)     Rx / DC Orders   ED Discharge Orders     None        Note:  This document was prepared using Dragon voice recognition software and may include unintentional dictation errors.    Willy Eddy, MD 09/28/23 [phone]

## 2023-09-28 NOTE — ED Notes (Signed)
Phelbotomy at bedside 

## 2023-09-29 ENCOUNTER — Inpatient Hospital Stay

## 2023-09-29 ENCOUNTER — Encounter: Payer: Self-pay | Admitting: Internal Medicine

## 2023-09-29 DIAGNOSIS — R292 Abnormal reflex: Secondary | ICD-10-CM

## 2023-09-29 DIAGNOSIS — E538 Deficiency of other specified B group vitamins: Secondary | ICD-10-CM

## 2023-09-29 DIAGNOSIS — E559 Vitamin D deficiency, unspecified: Secondary | ICD-10-CM

## 2023-09-29 DIAGNOSIS — G992 Myelopathy in diseases classified elsewhere: Secondary | ICD-10-CM | POA: Diagnosis not present

## 2023-09-29 DIAGNOSIS — E785 Hyperlipidemia, unspecified: Secondary | ICD-10-CM | POA: Diagnosis not present

## 2023-09-29 DIAGNOSIS — M47812 Spondylosis without myelopathy or radiculopathy, cervical region: Secondary | ICD-10-CM | POA: Diagnosis not present

## 2023-09-29 DIAGNOSIS — R531 Weakness: Secondary | ICD-10-CM | POA: Diagnosis not present

## 2023-09-29 DIAGNOSIS — R29898 Other symptoms and signs involving the musculoskeletal system: Secondary | ICD-10-CM

## 2023-09-29 DIAGNOSIS — F250 Schizoaffective disorder, bipolar type: Secondary | ICD-10-CM | POA: Diagnosis not present

## 2023-09-29 DIAGNOSIS — M4802 Spinal stenosis, cervical region: Secondary | ICD-10-CM | POA: Diagnosis not present

## 2023-09-29 DIAGNOSIS — F418 Other specified anxiety disorders: Secondary | ICD-10-CM | POA: Diagnosis not present

## 2023-09-29 LAB — SURGICAL PCR SCREEN
MRSA, PCR: NEGATIVE
Staphylococcus aureus: NEGATIVE

## 2023-09-29 LAB — BASIC METABOLIC PANEL WITH GFR
Anion gap: 9 (ref 5–15)
BUN: 10 mg/dL (ref 6–20)
CO2: 25 mmol/L (ref 22–32)
Calcium: 9.1 mg/dL (ref 8.9–10.3)
Chloride: 102 mmol/L (ref 98–111)
Creatinine, Ser: 0.64 mg/dL (ref 0.61–1.24)
GFR, Estimated: >60 mL/min (ref >60)
Glucose, Bld: 86 mg/dL (ref 70–99)
Potassium: 3.6 mmol/L (ref 3.5–5.1)
Sodium: 136 mmol/L (ref 135–145)

## 2023-09-29 LAB — CBC
HCT: 41.7 % (ref 39.0–52.0)
Hemoglobin: 14.2 g/dL (ref 13.0–17.0)
MCH: 28.5 pg (ref 26.0–34.0)
MCHC: 34.1 g/dL (ref 30.0–36.0)
MCV: 83.7 fL (ref 80.0–100.0)
Platelets: 217 10*3/uL (ref 150–400)
RBC: 4.98 MIL/uL (ref 4.22–5.81)
RDW: 12.9 % (ref 11.5–15.5)
WBC: 5.5 10*3/uL (ref 4.0–10.5)
nRBC: 0 % (ref 0.0–0.2)

## 2023-09-29 LAB — GLUCOSE, CAPILLARY: Glucose-Capillary: 96 mg/dL (ref 70–99)

## 2023-09-29 MED ORDER — GADOBUTROL 1 MMOL/ML IV SOLN
7.0000 mL | Freq: Once | INTRAVENOUS | Status: AC | PRN
Start: 2023-09-29 — End: 2023-09-29
  Administered 2023-09-29: 7 mL via INTRAVENOUS

## 2023-09-29 MED ORDER — ENSURE ENLIVE PO LIQD: 237.0000 mL | Freq: Two times a day (BID) | ORAL | Status: DC

## 2023-09-29 MED ORDER — PNEUMOCOCCAL 20-VAL CONJ VACC 0.5 ML IM SUSY
0.5000 mL | PREFILLED_SYRINGE | INTRAMUSCULAR | Status: AC
  Administered 2023-09-30: 0.5 mL via INTRAMUSCULAR
  Filled 2023-09-29: qty 0.5

## 2023-09-29 MED ORDER — VITAMIN D 25 MCG (1000 UNIT) PO TABS
1000.0000 [IU] | ORAL_TABLET | Freq: Every day | ORAL | Status: DC
  Administered 2023-09-29 – 2023-09-30 (×2): 1000 [IU] via ORAL
  Filled 2023-09-29 (×2): qty 1

## 2023-09-29 NOTE — Assessment & Plan Note (Signed)
 B12 level very low a few months back.  Continue oral B12 supplementation.  B12 level up at 563.

## 2023-09-29 NOTE — Assessment & Plan Note (Signed)
 Continue vitamin D supplementation

## 2023-09-29 NOTE — Hospital Course (Signed)
 53 y.o. male with medical history significant of hyperlipidemia, diet-controlled diabetes, stroke, depression with anxiety, PTSD, schizophrenia, who presents with leg numbness, tingling, weakness.   Patient states that he has right leg numbness, tingling and weakness for more than 6 months, which has been progressively worsening.  Now he has difficult ambulating due to right leg weakness.  Initially his numbness and weakness were located in the lower leg, which has been gradually spreading to whole right leg. He state that he started feeling left leg numbness and tingling in the past several weeks, but no left leg weakness.  Patient states that he fell several times in the past, no LOC.  Strongly denies any head or neck injury.  Refused CT scan of head and neck.  He feels a bandlike sensation across his chest intermittently. No SOB, chest pain, cough.  No nausea, vomiting, diarrhea or abdominal pain.  No symptoms of UTI.  No loss of control of bladder or bowel movement.  Patient does not have facial droop or slurred speech.  No vision loss or hearing loss.  No difficulty swallowing.   Pt was seen by Dr. Katrinka Blazing of neurosurgery, who is concerned for myelopathy. Pt was sent to ED for further evaluation with MRI of the cervical and thoracic spine.    Data reviewed independently and ED Course: pt was found to have WBC 6.7, GFR > 60, temperature normal, blood pressure 129/85, heart rate 58, RR 16, oxygen saturation 98% on room air.  Patient is admitted to MedSurg bed as inpatient.  Dr. Katrinka Blazing of neurosurgery and Dr. Selina Cooley of neurology are consulted.    MRI-C spin 1. Normal MRI appearance of the cervical spinal cord. 2. Degenerative spondylosis at C5-6 with resultant moderate spinal stenosis, with severe right and mild-to-moderate left C6 foraminal narrowing. 3. Degenerative disc osteophyte at C6-7 with resultant moderate left and mild-to-moderate right C7 foraminal stenosis. 4. Additional mild noncompressive  disc bulging at C3-4 and C4-5 without significant stenosis or impingement.    MRI-T spin Normal MRI of the thoracic spine and spinal cord. No findings to explain patient's symptoms.  4/10.  Patient states he exam having issues with his right leg since Thanksgiving.  Now his right leg from the knee down is numb and tingling and weak.  He has been having trouble walking.  Some symptoms going to his left side also.  Seen by neurology and ordered MRI of the cervical and thoracic spine with contrast to rule out demyelinating disease. 4/11.  MRI of the cervical and thoracic spine with contrast did not show any demyelinating disease.  Neurology recommends an EMG.  This will have to be done as outpatient.  Case discussed with neurosurgery and since the cervical stenosis is mild he would rather not operate at this point in time.  Patient will go back to outpatient physical therapy.

## 2023-09-29 NOTE — Assessment & Plan Note (Signed)
Hold Crestor 

## 2023-09-29 NOTE — Assessment & Plan Note (Addendum)
 Right lower extremity weakness numbness and loss of feeling.  MRI of the thoracic and cervical spine with contrast did not show any demyelinating disease.  Neurology recommended an outpatient EMG.
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
## 2023-09-29 NOTE — Assessment & Plan Note (Signed)
 Neurosurgery following to decide on whether or not to do a cervical spine procedure pending results of cervical and thoracic spine MRI with contrast.

## 2023-09-29 NOTE — Plan of Care (Signed)
  Problem: Education: Goal: Knowledge of General Education information will improve Description: Including pain rating scale, medication(s)/side effects and non-pharmacologic comfort measures Outcome: Progressing   Problem: Nutrition: Goal: Adequate nutrition will be maintained Outcome: Progressing   Problem: Coping: Goal: Level of anxiety will decrease Outcome: Progressing   Problem: Elimination: Goal: Will not experience complications related to bowel motility Outcome: Progressing Goal: Will not experience complications related to urinary retention Outcome: Progressing   Problem: Safety: Goal: Ability to remain free from injury will improve Outcome: Progressing

## 2023-09-29 NOTE — Plan of Care (Signed)

## 2023-09-29 NOTE — Progress Notes (Signed)
 Received patient via wheel chair, alert, respond appropriately to questions and commands.  Ambulatory to bathroom  to void, with cane, unsteady gait observed, assisted to bed.  Writer instructed patient to use call light for assist when wanting to get out of bed.  Urinal provided.  Patient state understanding.

## 2023-09-29 NOTE — Assessment & Plan Note (Signed)
 Does not look like on any medications for this

## 2023-09-29 NOTE — Progress Notes (Signed)
 Progress Note   Patient: Alex Turner LKG:[id number] DOB: 12-05-1970 DOA: 09/28/2023     1 DOS: the patient was seen and examined on 09/29/2023   Brief hospital course: 53 y.o. male with medical history significant of hyperlipidemia, diet-controlled diabetes, stroke, depression with anxiety, PTSD, schizophrenia, who presents with leg numbness, tingling, weakness.   Patient states that he has right leg numbness, tingling and weakness for more than 6 months, which has been progressively worsening.  Now he has difficult ambulating due to right leg weakness.  Initially his numbness and weakness were located in the lower leg, which has been gradually spreading to whole right leg. He state that he started feeling left leg numbness and tingling in the past several weeks, but no left leg weakness.  Patient states that he fell several times in the past, no LOC.  Strongly denies any head or neck injury.  Refused CT scan of head and neck.  He feels a bandlike sensation across his chest intermittently. No SOB, chest pain, cough.  No nausea, vomiting, diarrhea or abdominal pain.  No symptoms of UTI.  No loss of control of bladder or bowel movement.  Patient does not have facial droop or slurred speech.  No vision loss or hearing loss.  No difficulty swallowing.   Pt was seen by Dr. Katrinka Blazing of neurosurgery, who is concerned for myelopathy. Pt was sent to ED for further evaluation with MRI of the cervical and thoracic spine.    Data reviewed independently and ED Course: pt was found to have WBC 6.7, GFR > 60, temperature normal, blood pressure 129/85, heart rate 58, RR 16, oxygen saturation 98% on room air.  Patient is admitted to MedSurg bed as inpatient.  Dr. Katrinka Blazing of neurosurgery and Dr. Selina Cooley of neurology are consulted.    MRI-C spin 1. Normal MRI appearance of the cervical spinal cord. 2. Degenerative spondylosis at C5-6 with resultant moderate spinal stenosis, with severe right and mild-to-moderate left C6  foraminal narrowing. 3. Degenerative disc osteophyte at C6-7 with resultant moderate left and mild-to-moderate right C7 foraminal stenosis. 4. Additional mild noncompressive disc bulging at C3-4 and C4-5 without significant stenosis or impingement.    MRI-T spin Normal MRI of the thoracic spine and spinal cord. No findings to explain patient's symptoms.  4/10.  Patient states he exam having issues with his right leg since Thanksgiving.  Now his right leg from the knee down is numb and tingling and weak.  He has been having trouble walking.  Some symptoms going to his left side also.  Seen by neurology and ordered MRI of the cervical and thoracic spine with contrast to rule out demyelinating disease.   Assessment and Plan: * Stenosis of cervical spine with myelopathy [facility]) Neurosurgery following to decide on whether or not to do a cervical spine procedure pending results of cervical and thoracic spine MRI with contrast.  Weakness of right lower extremity Right lower extremity weakness numbness and loss of feeling.  MRI of the thoracic and cervical spine ordered to rule out demyelinating disease.  Vitamin B12 deficiency B12 level very low a few months back.  Continue oral B12 supplementation recheck B12 level tomorrow  Vitamin D deficiency Continue vitamin D supplementation  Hyperlipidemia Hold Crestor  Schizoaffective disorder, bipolar type (HCC) Does not look like on any medications for this  Tobacco use Nicotine patch        Subjective: Patient stated since Thanksgiving he has been having problems with his right leg.  Currently  having numbness tingling and weakness of the right lower extremity from the knee down.  It is extending up to his lower back and out to his left side.  Admitted for this weakness.  Physical Exam: Vitals:   09/29/23 0112 09/29/23 0300 09/29/23 0402 09/29/23 0839  BP: 109/73 114/66 (!) 145/79 118/73  Pulse:  (!) 53 (!) 56 60  Resp: 16 16 18 16    Temp:   98 F (36.7 C) 98 F (36.7 C)  TempSrc:      SpO2: 96% 93% 99% 100%  Weight:      Height:       Physical Exam HENT:     Head: Normocephalic.     Mouth/Throat:     Pharynx: No oropharyngeal exudate.  Eyes:     General: Lids are normal.     Conjunctiva/sclera: Conjunctivae normal.  Cardiovascular:     Rate and Rhythm: Normal rate and regular rhythm.     Heart sounds: Normal heart sounds, S1 normal and S2 normal.  Pulmonary:     Breath sounds: No decreased breath sounds, wheezing, rhonchi or rales.  Abdominal:     Palpations: Abdomen is soft.     Tenderness: There is no abdominal tenderness.  Musculoskeletal:     Right lower leg: No swelling.     Left lower leg: No swelling.  Skin:    General: Skin is warm.     Findings: No rash.  Neurological:     Mental Status: He is alert and oriented to person, place, and time.     Comments: Patient needed to use his arms in order to lift his right leg up off the bed.  Once the right leg was up off the bed he was able to flex and extend at the ankle but unable to do it while he was lying flat on the bed.  Able to straight leg raise with his left leg and flex and extend at the ankle.  Poor sensation from the right knee down.     Data Reviewed: MRI of the cervical spine and thoracic spine reviewed as above Prior MRI of the brain without contrast was negative, prior MRI of the lumbar spine did not see any impingement. Prior B12 level on 07/25/2023 was 176.   Disposition: Status is: Inpatient Remains inpatient appropriate because: Neurology getting a MRI of the thoracic and cervical spine with contrast to rule out demyelinating disease prior to any neurosurgical procedure  Planned Discharge Destination: Home    Time spent: 28 minutes  Author: Alford Highland, MD 09/29/2023 11:55 AM  For on call review [email or web address].

## 2023-09-29 NOTE — Assessment & Plan Note (Signed)
 Nicotine patch

## 2023-09-29 NOTE — TOC CM/SW Note (Signed)
 Transition of Care [facility]) - Inpatient Brief Assessment   Patient Details  Name: Alex Turner MRN: [id number] Date of Birth: [birth year]  [facility]) CM/SW Contact:    Chapman Fitch, RN Phone Number: 09/29/2023, 3:19 PM   Clinical Narrative:  Transition of Care [facility]) Screening Note   Patient Details  Name: Alex Turner Date of Birth: [birth year]   Transition of Care [facility]) CM/SW Contact:    Chapman Fitch, RN Phone Number: 09/29/2023, 3:19 PM    Transition of Care Department [facility]) has reviewed patient and no TOC needs have been identified at this time.If new patient transition needs arise, please place a TOC consult.   Per MD "Neurosurgery following to decide on whether or not to do a cervical spine procedure pending results of cervical and thoracic spine MRI with contrast. ".  If appropriate please order PT and OT eval     Transition of Care Asessment: Insurance and Status: Insurance coverage has been reviewed Patient has primary care physician: Yes     Prior/Current Home Services: No current home services Social Drivers of Health Review: SDOH reviewed no interventions necessary Readmission risk has been reviewed: Yes Transition of care needs: transition of care needs identified, TOC will continue to follow

## 2023-09-30 ENCOUNTER — Telehealth: Admitting: Neurosurgery

## 2023-09-30 DIAGNOSIS — M4802 Spinal stenosis, cervical region: Secondary | ICD-10-CM | POA: Diagnosis not present

## 2023-09-30 DIAGNOSIS — E538 Deficiency of other specified B group vitamins: Secondary | ICD-10-CM | POA: Diagnosis not present

## 2023-09-30 DIAGNOSIS — E785 Hyperlipidemia, unspecified: Secondary | ICD-10-CM | POA: Diagnosis not present

## 2023-09-30 DIAGNOSIS — R29898 Other symptoms and signs involving the musculoskeletal system: Secondary | ICD-10-CM | POA: Diagnosis not present

## 2023-09-30 DIAGNOSIS — E559 Vitamin D deficiency, unspecified: Secondary | ICD-10-CM | POA: Diagnosis not present

## 2023-09-30 DIAGNOSIS — G992 Myelopathy in diseases classified elsewhere: Secondary | ICD-10-CM | POA: Diagnosis not present

## 2023-09-30 DIAGNOSIS — R202 Paresthesia of skin: Secondary | ICD-10-CM | POA: Diagnosis not present

## 2023-09-30 DIAGNOSIS — F250 Schizoaffective disorder, bipolar type: Secondary | ICD-10-CM | POA: Diagnosis not present

## 2023-09-30 LAB — GLUCOSE, CAPILLARY: Glucose-Capillary: 94 mg/dL (ref 70–99)

## 2023-09-30 LAB — VITAMIN B12: Vitamin B-12: 563 pg/mL (ref 180–914)

## 2023-09-30 LAB — CK: Total CK: 47 U/L — ABNORMAL LOW (ref 49–397)

## 2023-09-30 MED ORDER — OXYCODONE-ACETAMINOPHEN 5-325 MG PO TABS: 1.0000 | ORAL_TABLET | Freq: Four times a day (QID) | ORAL | 0 refills | Status: DC | PRN

## 2023-09-30 MED ORDER — METHOCARBAMOL 500 MG PO TABS: 500.0000 mg | ORAL_TABLET | Freq: Three times a day (TID) | ORAL | 0 refills | Status: AC | PRN

## 2023-09-30 MED ORDER — ENSURE ENLIVE PO LIQD: 237.0000 mL | Freq: Two times a day (BID) | ORAL | 0 refills | Status: AC

## 2023-09-30 MED ORDER — VITAMIN D3 25 MCG PO TABS: 1000.0000 [IU] | ORAL_TABLET | Freq: Every day | ORAL | 0 refills | Status: AC

## 2023-09-30 MED ORDER — NICOTINE 21 MG/24HR TD PT24: MEDICATED_PATCH | TRANSDERMAL | 0 refills | Status: DC

## 2023-09-30 NOTE — Evaluation (Signed)
 Physical Therapy Evaluation Patient Details Name: Alex Turner MRN: [id number] DOB: Oct 26, 1970 Today's Date: 09/30/2023  History of Present Illness  presented to ER secondary to progressive R LE numbness, weakness, band-like sensation across chest; admitted for management of cervical spine stenosis (and consideration for ACDF).  C-spine and T-spine imaging generally unremakable with regards to spine; to complete EMG/NCS as outpatient prior to final decision.  Clinical Impression  Patient seated in recliner beginning of session.  Alert and oriented to basic information; follows commands and agreeable to participation with session.  Generally impulsive with mobility; appears easily distractible by both internal/external environment.  With isolated testing, demonstrates functional weakness to R knee and ankle; endorses generalized paresthesia mid-thigh distally.  However, with functional activities, appears to demonstrate different level of functional strength (even changing gait pattern/performance throughout distance).  Currently requiring cga/close sup for sit/stand, standing balance and gait (150') with RW.  Demonstrates self-selecting 3-point gait pattern, leading with R LE. Tends to maintain R LE in rigid, isometric knee extension (self-perceives this as 'dragging' R LE) and R ankle in sustained, isometric dorsiflexion (maintaining all weight in R heel and R mid/forefoot off of ground-indicating continuous, active DF abilities). Does demonstrate multiple instances of standing rest breaks and leaning over RW for rest periods, pain management; no episodes of buckling or overt LOB  Patient feels walker is beneficial and would appreciate one for discharge; physician informed/aware. Would benefit from skilled PT to address above deficits and promote optimal return to PLOF.; recommend post-acute PT follow up as indicated by interdisciplinary care team.            If plan is discharge home, recommend the  following: A little help with walking and/or transfers;A little help with bathing/dressing/bathroom   Can travel by private vehicle        Equipment Recommendations Rolling walker (2 wheels)  Recommendations for Other Services       Functional Status Assessment Patient has had a recent decline in their functional status and demonstrates the ability to make significant improvements in function in a reasonable and predictable amount of time.     Precautions / Restrictions Precautions Precautions: Fall Restrictions Weight Bearing Restrictions Per Provider Order: No      Mobility  Bed Mobility Overal bed mobility: Modified Independent                  Transfers Overall transfer level: Needs assistance Equipment used: None, Rolling walker (2 wheels) Transfers: Sit to/from Stand Sit to Stand: Supervision, Contact guard assist                Ambulation/Gait Ambulation/Gait assistance: Supervision, Contact guard assist Gait Distance (Feet): 150 Feet Assistive device: Rolling walker (2 wheels)         General Gait Details: self-selecting 3-point gait pattern, leading with R LE.  Tends to maintain R LE in rigid, isometric knee extension (self-perceives this as 'dragging' R LE) and R ankle in sustained, isometric dorsiflexion (maintaining all weight in R heel and R mid/forefoot off of ground-indicating continuous, active DF abilities).  Does demonstrate multiple instances of standing rest breaks and leaning over RW for rest periods, pain management; no episodes of buckling or overt LOB  Stairs            Wheelchair Mobility     Tilt Bed    Modified Rankin (Stroke Patients Only)       Balance Overall balance assessment: Needs assistance Sitting-balance support: No upper extremity supported, Feet supported  Sitting balance-Leahy Scale: Good     Standing balance support: Bilateral upper extremity supported Standing balance-Leahy Scale: Fair                                Pertinent Vitals/Pain Pain Assessment Pain Assessment: 0-10 Pain Score: 4  Pain Location: R LE, back Pain Descriptors / Indicators: Aching Pain Intervention(s): Limited activity within patient's tolerance, Monitored during session, Repositioned    Home Living Family/patient expects to be discharged to:: Private residence Living Arrangements: Parent Available Help at Discharge: Family Type of Home: House Home Access: Stairs to enter Entrance Stairs-Rails: Left Entrance Stairs-Number of Steps: 2-3   Home Layout: One level Home Equipment: Cane - single point      Prior Function Prior Level of Function : Independent/Modified Independent             Mobility Comments: Mod indep with SPC for ADLs, household and community mobilization; does endorse at least 4 falls in previous six months.  Was actively participating with outpatient PT prior to admission (though didn't feel it helpful)       Extremity/Trunk Assessment   Upper Extremity Assessment Upper Extremity Assessment: Overall WFL for tasks assessed    Lower Extremity Assessment Lower Extremity Assessment: RLE deficits/detail;LLE deficits/detail RLE Deficits / Details: presentation grossly inconsistent.  With isolated testing, R hip 4/5, R knee ext 3-/5, R knee flex 3-/5, R ankle DF/PF 0-1/5, toe flex/ext, 3-/5. However, with automatic, functional activities, demonstrates strength at least 3+ to 4+/5 throughout all muscle groups in R LE.  Endorses generalized paresthesia R mid-thigh distally LLE Deficits / Details: grossly 5/5 throughout       Communication        Cognition Arousal: Alert Behavior During Therapy: WFL for tasks assessed/performed   PT - Cognitive impairments: No apparent impairments                                 Cueing       General Comments      Exercises Other Exercises Other Exercises: Educated in R LE hamstring stretch, piriformis  stretching and gentle, lumbar rotation (with neutral spine) for stretching/relaxation of posterior hip/spine musculature; patient returned demonstration (though generally haphazardly and half-attentively) and agrees to perform as HEP outside of formal therapy Other Exercises: Verbally reviewed technique for stair training and car transfers; patient able to indep verbalize correct technique   Assessment/Plan    PT Assessment Patient needs continued PT services  PT Problem List Decreased strength;Decreased range of motion;Decreased activity tolerance;Decreased balance;Decreased mobility;Impaired sensation;Decreased safety awareness;Decreased knowledge of precautions       PT Treatment Interventions DME instruction;Gait training;Stair training;Functional mobility training;Therapeutic activities;Therapeutic exercise;Balance training;Patient/family education    PT Goals (Current goals can be found in the Care Plan section)  Acute Rehab PT Goals Patient Stated Goal: to go home PT Goal Formulation: With patient Time For Goal Achievement: 10/14/23 Potential to Achieve Goals: Good    Frequency Min 2X/week     Co-evaluation               AM-PAC PT "6 Clicks" Mobility  Outcome Measure Help needed turning from your back to your side while in a flat bed without using bedrails?: None Help needed moving from lying on your back to sitting on the side of a flat bed without using bedrails?: None Help needed moving to  and from a bed to a chair (including a wheelchair)?: None Help needed standing up from a chair using your arms (e.g., wheelchair or bedside chair)?: None Help needed to walk in hospital room?: A Little Help needed climbing 3-5 steps with a railing? : A Little 6 Click Score: 22    End of Session Equipment Utilized During Treatment: Gait belt Activity Tolerance: Patient tolerated treatment well Patient left: in bed;with call bell/phone within reach;with bed alarm set Nurse  Communication: Mobility status PT Visit Diagnosis: Muscle weakness (generalized) (M62.81);Difficulty in walking, not elsewhere classified (R26.2);Pain Pain - Right/Left: Right Pain - part of body: Leg    Time: 1140-1211 PT Time Calculation (min) (ACUTE ONLY): 31 min   Charges:   PT Evaluation $PT Eval Moderate Complexity: 1 Mod PT Treatments $Therapeutic Activity: 8-22 mins PT General Charges $$ ACUTE PT VISIT: 1 Visit         Alvaro Aungst H. Manson Passey, PT, DPT, NCS 09/30/23, 9:59 PM [phone]

## 2023-09-30 NOTE — TOC Transition Note (Signed)
 Transition of Care [facility]) - Discharge Note   Patient Details  Name: Alex Turner MRN: [id number] Date of Birth: Apr 13, 1971  Transition of Care [facility]) CM/SW Contact:  Alesia Richards, RN 09/30/2023, 12:19 PM   Clinical Narrative:     Discharge orders noted. Rolling walker noted. CM to patient's room regarding pending discharge and verification of transportation. Per patient, has a friend to provide transportation. CM and patient discussed order for rolling walker. Patient prefers to discharge to home and have rolling walker delivered to address on file.   Final next level of care: Home/Self Care     Patient Goals and CMS Choice    To return home   Discharge Placement       Home/self care         Discharge Plan and Services Additional resources added to the After Visit Summary for     Discharge Planning Services: CM Consult Post Acute Care Choice: Durable Medical Equipment ---Rolling walker              Date [facility] Agency Contacted: 09/30/23 Time HH Agency Contacted: 1219 Representative spoke with at [facility] Agency: Mitch  Social Drivers of Health (SDOH) Interventions SDOH Screenings   Food Insecurity: No Food Insecurity (09/29/2023)  Housing: Low Risk  (09/29/2023)  Transportation Needs: No Transportation Needs (09/29/2023)  Utilities: Not At Risk (09/29/2023)  Alcohol Screen: Low Risk  (08/14/2023)  Depression (PHQ2-9): Medium Risk (08/18/2023)  Financial Resource Strain: Low Risk  (08/14/2023)  Physical Activity: Unknown (08/14/2023)  Social Connections: Socially Isolated (08/14/2023)  Stress: Stress Concern Present (08/14/2023)  Tobacco Use: High Risk (09/29/2023)     Readmission Risk Interventions     No data to display

## 2023-09-30 NOTE — Discharge Summary (Signed)
 Physician Discharge Summary   Patient: Alex Turner MRN: [id number] DOB: 02-08-1971  Admit date:     09/28/2023  Discharge date: 09/30/23  Discharge Physician: Alford Highland   PCP: Sandre Kitty, MD   Recommendations at discharge:   Follow-up PCP 5 days Follow-up with your neurologist Needs an EMG Follow-up with neurosurgery  Discharge Diagnoses: Principal Problem:   Stenosis of cervical spine with myelopathy (HCC) Active Problems:   Weakness of right lower extremity   Vitamin B12 deficiency   Vitamin D deficiency   Hyperlipidemia   Schizoaffective disorder, bipolar type (HCC)   Depression with anxiety   Tobacco use   Hospital Course: 53 y.o. male with medical history significant of hyperlipidemia, diet-controlled diabetes, stroke, depression with anxiety, PTSD, schizophrenia, who presents with leg numbness, tingling, weakness.   Patient states that he has right leg numbness, tingling and weakness for more than 6 months, which has been progressively worsening.  Now he has difficult ambulating due to right leg weakness.  Initially his numbness and weakness were located in the lower leg, which has been gradually spreading to whole right leg. He state that he started feeling left leg numbness and tingling in the past several weeks, but no left leg weakness.  Patient states that he fell several times in the past, no LOC.  Strongly denies any head or neck injury.  Refused CT scan of head and neck.  He feels a bandlike sensation across his chest intermittently. No SOB, chest pain, cough.  No nausea, vomiting, diarrhea or abdominal pain.  No symptoms of UTI.  No loss of control of bladder or bowel movement.  Patient does not have facial droop or slurred speech.  No vision loss or hearing loss.  No difficulty swallowing.   Pt was seen by Dr. Katrinka Blazing of neurosurgery, who is concerned for myelopathy. Pt was sent to ED for further evaluation with MRI of the cervical and thoracic spine.     Data reviewed independently and ED Course: pt was found to have WBC 6.7, GFR > 60, temperature normal, blood pressure 129/85, heart rate 58, RR 16, oxygen saturation 98% on room air.  Patient is admitted to MedSurg bed as inpatient.  Dr. Katrinka Blazing of neurosurgery and Dr. Selina Cooley of neurology are consulted.    MRI-C spin 1. Normal MRI appearance of the cervical spinal cord. 2. Degenerative spondylosis at C5-6 with resultant moderate spinal stenosis, with severe right and mild-to-moderate left C6 foraminal narrowing. 3. Degenerative disc osteophyte at C6-7 with resultant moderate left and mild-to-moderate right C7 foraminal stenosis. 4. Additional mild noncompressive disc bulging at C3-4 and C4-5 without significant stenosis or impingement.    MRI-T spin Normal MRI of the thoracic spine and spinal cord. No findings to explain patient's symptoms.  4/10.  Patient states he exam having issues with his right leg since Thanksgiving.  Now his right leg from the knee down is numb and tingling and weak.  He has been having trouble walking.  Some symptoms going to his left side also.  Seen by neurology and ordered MRI of the cervical and thoracic spine with contrast to rule out demyelinating disease. 4/11.  MRI of the cervical and thoracic spine with contrast did not show any demyelinating disease.  Neurology recommends an EMG.  This will have to be done as outpatient.  Case discussed with neurosurgery and since the cervical stenosis is mild he would rather not operate at this point in time.  Patient will go back to outpatient  physical therapy.   Assessment and Plan: * Stenosis of cervical spine with myelopathy [facility]) Case discussed with neurosurgery and neurology.  Cervical stenosis is mild and may not end up being the cause of the patient's symptoms.  Deferred surgery at this point but will follow-up as outpatient after EMG.  Weakness of right lower extremity Right lower extremity weakness numbness and  loss of feeling.  MRI of the thoracic and cervical spine with contrast did not show any demyelinating disease.  Neurology recommended an outpatient EMG.  Vitamin B12 deficiency B12 level very low a few months back.  Continue oral B12 supplementation.  B12 level up at 563.  Vitamin D deficiency Continue vitamin D supplementation  Hyperlipidemia Can go back on Crestor as outpatient  Schizoaffective disorder, bipolar type (HCC) Does not look like on any medications for this  Tobacco use Nicotine patch         Consultants: Neurosurgery, neurology Procedures performed: None Disposition: Home Diet recommendation:  Regular diet DISCHARGE MEDICATION: Allergies as of 09/30/2023       Reactions   Bee Venom Anaphylaxis   Tape Dermatitis   Penicillins Other (See Comments)   Unknown childhood allergy   Wellbutrin [bupropion Hcl] Swelling, Rash        Medication List     STOP taking these medications    ibuprofen 200 MG tablet Commonly known as: Advil   Vitamin D (Ergocalciferol) 1.25 MG (50000 UNIT) Caps capsule Commonly known as: DRISDOL       TAKE these medications    acetaminophen 325 MG tablet Commonly known as: Tylenol Take 2 tablets (650 mg total) by mouth every 6 (six) hours.   Cyanocobalamin 5000 MCG Tbdp Take 1 tablet by mouth daily.   feeding supplement Liqd Take 237 mLs by mouth 2 (two) times daily between meals.   ketoconazole 2%-triamcinolone 0.1% 1:2 cream mixture Apply topically 2 (two) times daily.   methocarbamol 500 MG tablet Commonly known as: ROBAXIN Take 1 tablet (500 mg total) by mouth every 8 (eight) hours as needed for muscle spasms.   nicotine 21 mg/24hr patch Commonly known as: NICODERM CQ - dosed in mg/24 hours One 21mg  patch chest wall daily (okay to substitute generic) Start taking on: October 01, 2023   oxyCODONE-acetaminophen 5-325 MG tablet Commonly known as: PERCOCET/ROXICET Take 1 tablet by mouth every 6 (six) hours as  needed for severe pain (pain score 7-10).   rosuvastatin 5 MG tablet Commonly known as: Crestor Take 1 tablet (5 mg total) by mouth daily.   vitamin D3 25 MCG tablet Commonly known as: CHOLECALCIFEROL Take 1 tablet (1,000 Units total) by mouth daily. Start taking on: October 01, 2023               Durable Medical Equipment  (From admission, onward)           Start     Ordered   09/30/23 1209  For home use only DME Walker rolling  Once       Question Answer Comment  Walker: With 5 Inch Wheels   Patient needs a walker to treat with the following condition Right leg weakness      09/30/23 1209            Follow-up Information     Sandre Kitty, MD. Go on 10/05/2023.   Specialty: Family Medicine Why: The office will give you a call to schedule your follow up appointment. Contact information: [address] [place] [address] [phone]  Lovenia Kim, MD Follow up in 2 week(s).   Specialty: Neurosurgery Contact information: [address] [place] [address] [phone]         your neurologist Follow up in 1 week(s).   Why: will need EMG               Discharge Exam: Filed Weights   09/28/23 1137  Weight: 67.6 kg   Physical Exam HENT:     Head: Normocephalic.     Mouth/Throat:     Pharynx: No oropharyngeal exudate.  Eyes:     General: Lids are normal.     Conjunctiva/sclera: Conjunctivae normal.  Cardiovascular:     Rate and Rhythm: Normal rate and regular rhythm.     Heart sounds: Normal heart sounds, S1 normal and S2 normal.  Pulmonary:     Breath sounds: No decreased breath sounds, wheezing, rhonchi or rales.  Abdominal:     Palpations: Abdomen is soft.     Tenderness: There is no abdominal tenderness.  Musculoskeletal:     Right lower leg: No swelling.     Left lower leg: No swelling.  Skin:    General: Skin is warm.     Findings: No rash.  Neurological:     Mental Status: He is  alert and oriented to person, place, and time.     Comments: Patient needed to use his arms in order to lift his right leg up off the bed.  Once the right leg was up off the bed he was able to flex and extend at the ankle but unable to do it while he was lying flat on the bed.  Able to straight leg raise with his left leg and flex and extend at the ankle.  Poor sensation from the right knee down.  Physical therapist noted that when he was walking with her that he was keeping his right toes up in the air and walking more on his heel      Condition at discharge: stable  The results of significant diagnostics from this hospitalization (including imaging, microbiology, ancillary and laboratory) are listed below for reference.   Imaging Studies: MR THORACIC SPINE W CONTRAST Result Date: 09/29/2023 CLINICAL DATA:  Initial evaluation for bilateral lower extremity weakness, evaluate for demyelination. Sensory symptoms locating the T4. EXAM: MRI THORACIC SPINE WITH CONTRAST TECHNIQUE: Multiplanar, multisequence MR imaging of the thoracic spine was performed following the administration of intravenous contrast. CONTRAST:  7mL GADAVIST GADOBUTROL 1 MMOL/ML IV SOLN COMPARISON:  Comparison made with prior noncontrast MRI from 09/28/2023. FINDINGS: Alignment: Physiologic with preservation of the normal thoracic kyphosis. No listhesis. Vertebrae: Vertebral body height maintained without acute or interval fracture. Normal bone marrow signal intensity on this limited postcontrast exam. No worrisome osseous lesions or abnormal enhancement. Cord: Normal signal and morphology on this postcontrast examination. No abnormal enhancement. Paraspinal and other soft tissues: Unremarkable. Disc levels: No significant disc pathology or stenosis within the thoracic spine. IMPRESSION: Normal postcontrast MRI of the thoracic spine and spinal cord. No abnormal enhancement. Electronically Signed   By: Rise Mu M.D.   On:  09/29/2023 18:26   MR CERVICAL SPINE W CONTRAST Result Date: 09/29/2023 CLINICAL DATA:  Initial evaluation for bilateral lower extremity weakness, possible demyelination. EXAM: MRI CERVICAL SPINE WITH CONTRAST TECHNIQUE: Multiplanar, multisequence MR imaging of the cervical spine was performed following the administration of intravenous contrast. COMPARISON:  Comparison made with noncontrast MRI from 09/24/2023. FINDINGS: Alignment: Straightening of the normal cervical  lordosis. No listhesis. Vertebrae: Vertebral body height maintained without acute or interval fracture. Grossly normal bone marrow signal intensity on this limited postcontrast exam. No abnormal enhancement. Cord: Normal signal morphology on this postcontrast exam. No abnormal enhancement. Posterior Fossa, vertebral arteries, paraspinal tissues: Unremarkable. Disc levels: Underlying mild cervical spondylosis, described on recent MRI. No new finding. IMPRESSION: 1. Normal MRI appearance of the cervical spinal cord. No abnormal enhancement. 2. Underlying mild cervical spondylosis, described on recent MRI. No new finding. Electronically Signed   By: Rise Mu M.D.   On: 09/29/2023 18:23   MR THORACIC SPINE WO CONTRAST Result Date: 09/28/2023 CLINICAL DATA:  Initial evaluation for progressive myelopathy, worsening weakness and numbness. EXAM: MRI THORACIC SPINE WITHOUT CONTRAST TECHNIQUE: Multiplanar, multisequence MR imaging of the thoracic spine was performed. No intravenous contrast was administered. COMPARISON:  Prior radiograph from 08/22/2023 FINDINGS: Alignment: Vertebral bodies normally aligned with preservation of the normal thoracic kyphosis. No listhesis. Vertebrae: Vertebral body height well maintained without acute or chronic fracture. Bone marrow signal intensity within normal limits. No discrete or worrisome osseous lesions. No significant abnormal marrow edema. Cord:  Normal signal and morphology. Paraspinal and other soft  tissues: Unremarkable. Disc levels: No significant disc pathology seen within the thoracic spine for patient age. No disc bulge or focal disc herniation. No significant facet disease. No canal or neural foraminal stenosis or evidence for neural impingement. IMPRESSION: Normal MRI of the thoracic spine and spinal cord. No findings to explain patient's symptoms. Electronically Signed   By: Rise Mu M.D.   On: 09/28/2023 18:52   MR Cervical Spine Wo Contrast Result Date: 09/28/2023 CLINICAL DATA:  Initial evaluation for progressive myelopathy, worsening weakness and numbness. EXAM: MRI CERVICAL SPINE WITHOUT CONTRAST TECHNIQUE: Multiplanar, multisequence MR imaging of the cervical spine was performed. No intravenous contrast was administered. COMPARISON:  Prior radiograph from 08/22/2023 as well as prior MRI from 05/01/2014. FINDINGS: Alignment: Vertebral bodies normally aligned with preservation of the normal cervical lordosis. No listhesis. Vertebrae: Vertebral body height maintained without acute or chronic fracture. Bone marrow signal intensity within normal limits. No worrisome osseous lesions. Degenerative reactive endplate change with mild marrow edema present about the C5-6 and C6-7 interspaces. No other abnormal marrow edema. Cord: Normal signal and morphology. Posterior Fossa, vertebral arteries, paraspinal tissues: Minimal patchy signal abnormality within the pons, most like related chronic microvascular ischemic disease. Visualized brain and posterior fossa otherwise unremarkable. Craniocervical junction within normal limits. Paraspinous soft tissues within normal limits. Normal flow voids seen within the vertebral arteries bilaterally. Disc levels: C2-C3: Mild uncovertebral spurring without significant disc bulge. No spinal stenosis. Foramina remain patent. C3-C4: Mild disc bulge with bilateral uncovertebral spurring. Mild bilateral facet hypertrophy. No significant spinal stenosis. Foramina  remain patent. C4-C5: Mild disc bulge with uncovertebral spurring. Mild left greater than right facet hypertrophy. No significant spinal stenosis. Foramina remain patent. C5-C6: Degenerate intervertebral disc space narrowing with diffuse disc osteophyte complex, asymmetric to the right. Broad posterior component flattens and effaces the ventral thecal sac. Secondary cord flattening without cord signal changes. Moderate spinal stenosis. Right worse than left uncovertebral spurring with resultant severe right with mild-to-moderate left C6 foraminal stenosis. C6-C7: Degenerative vertebral disc space narrowing with diffuse disc osteophyte complex. Flattening of the ventral thecal sac without significant spinal stenosis. Moderate left with mild to moderate right C7 foraminal stenosis. C7-T1: Minimal disc bulge. Mild left-sided facet hypertrophy. No spinal stenosis. Foramina remain patent. IMPRESSION: 1. Normal MRI appearance of the cervical spinal cord. 2. Degenerative  spondylosis at C5-6 with resultant moderate spinal stenosis, with severe right and mild-to-moderate left C6 foraminal narrowing. 3. Degenerative disc osteophyte at C6-7 with resultant moderate left and mild-to-moderate right C7 foraminal stenosis. 4. Additional mild noncompressive disc bulging at C3-4 and C4-5 without significant stenosis or impingement. Electronically Signed   By: Rise Mu M.D.   On: 09/28/2023 18:49    Microbiology: Results for orders placed or performed during the hospital encounter of 09/28/23  Surgical PCR screen     Status: None   Collection Time: 09/29/23 10:00 PM   Specimen: Nasal Mucosa; Nasal Swab  Result Value Ref Range Status   MRSA, PCR NEGATIVE NEGATIVE Final   Staphylococcus aureus NEGATIVE NEGATIVE Final    Comment: (NOTE) The Xpert SA Assay (FDA approved for NASAL specimens in patients 52 years of age and older), is one component of a comprehensive surveillance program. It is not intended to  diagnose infection nor to guide or monitor treatment. Performed at [facility], [address] [place], [place] [address]     Labs: CBC: Recent Labs  Lab 09/28/23 1139 09/29/23 0503  WBC 6.7 5.5  NEUTROABS 3.7  --   HGB 15.1 14.2  HCT 45.2 41.7  MCV 86.3 83.7  PLT 241 217   Basic Metabolic Panel: Recent Labs  Lab 09/28/23 1139 09/29/23 0503  NA 137 136  K 4.0 3.6  CL 105 102  CO2 24 25  GLUCOSE 89 86  BUN 11 10  CREATININE 0.69 0.64  CALCIUM 9.0 9.1   Liver Function Tests: Recent Labs  Lab 09/28/23 1139  AST 14*  ALT 10  ALKPHOS 48  BILITOT 0.5  PROT 7.4  ALBUMIN 4.3   CBG: Recent Labs  Lab 09/29/23 0923 09/30/23 0929  GLUCAP 96 94    Discharge time spent: greater than 30 minutes.  Signed: Alford Highland, MD [facility] 09/30/2023

## 2023-09-30 NOTE — Plan of Care (Addendum)
 Plan of care  This is a gentleman with worsening BLE weakness and c spine stenosis whom neurosurgery is considering ACDF on. Contrasted sequences of actual cord on MRI c and t spine are normal. BLE weakness is difficult to assess 2/2 patient's embellishment on exam. Recommend EMG/NCS prior to making a decision on ACDF and will cc his outpatient neurologist Dr. Lucia Gaskins on this note. Please cc: Dr. Ernestine Mcmurray of [facility] neurosurgery on consult note.  Bing Neighbors, MD [facility] [phone]  If 7pm- 7am, please page neurology on call as listed in AMION.

## 2023-09-30 NOTE — Progress Notes (Signed)
   09/30/23 1000  Spiritual Encounters  Type of Visit Initial  Care provided to: Patient  Referral source Chaplain assessment  Reason for visit Advance directives  OnCall Visit No   Chaplain received a spiritual consult for an AD. Chaplain educated the patient on the AD and went over paperwork. Patient shared with chaplain that his next of kin is his sister. Patient realized he did not need to fill out paperwork because she is already his POA. Chaplain sat and talked with patient about his upcoming surgery and his anxiety around that. Chaplain informed the patient that chaplain services remain available for spiritual and emotional support whenever he needs.

## 2023-10-01 DIAGNOSIS — Z419 Encounter for procedure for purposes other than remedying health state, unspecified: Secondary | ICD-10-CM | POA: Diagnosis not present

## 2023-10-03 ENCOUNTER — Ambulatory Visit (INDEPENDENT_AMBULATORY_CARE_PROVIDER_SITE_OTHER): Admitting: Family Medicine

## 2023-10-03 ENCOUNTER — Encounter: Payer: Self-pay | Admitting: Family Medicine

## 2023-10-03 ENCOUNTER — Telehealth: Payer: Self-pay | Admitting: *Deleted

## 2023-10-03 VITALS — BP 124/77 | HR 60 | Ht 66.0 in | Wt 147.8 lb

## 2023-10-03 DIAGNOSIS — R29898 Other symptoms and signs involving the musculoskeletal system: Secondary | ICD-10-CM

## 2023-10-03 DIAGNOSIS — R269 Unspecified abnormalities of gait and mobility: Secondary | ICD-10-CM | POA: Diagnosis not present

## 2023-10-03 DIAGNOSIS — E538 Deficiency of other specified B group vitamins: Secondary | ICD-10-CM | POA: Diagnosis not present

## 2023-10-03 MED ORDER — FOLIC ACID 1 MG PO TABS: 1.0000 mg | ORAL_TABLET | Freq: Every day | ORAL | 1 refills | Status: AC

## 2023-10-03 MED ORDER — VITAMIN B-1 100 MG PO TABS: 100.0000 mg | ORAL_TABLET | Freq: Every day | ORAL | 1 refills | Status: AC

## 2023-10-03 NOTE — Assessment & Plan Note (Signed)
 Patient on oral B12 5000 mcg and has had improvement in his B12 levels.  Given the progression of his neuropathy and weakness, we will also send in folic acid and thiamine supplementation.  Will check B6 level before repeating that since excessive levels can cause neuropathy themselves.

## 2023-10-03 NOTE — Telephone Encounter (Signed)
 Copied from CRM [id number]. Topic: Appointments - Appointment Scheduling >> Sep 30, 2023 12:29 PM [name] wrote: ER f/u getting discharged to  No appts til June.. Pls call patient number [phone]

## 2023-10-03 NOTE — Telephone Encounter (Signed)
 Pt scheduled today for hospital f/u

## 2023-10-03 NOTE — Progress Notes (Signed)
 Established Patient Office Visit  Subjective   Patient ID: Alex Turner, male    DOB: 08/04/1970  Age: 53 y.o. MRN: [id number]  Chief Complaint  Patient presents with   Hospitalization Follow-up    HPI  Patient here for hospital follow-up.  He was recently advised to go to the hospital for worsening of his right lower extremity weakness.  There he was admitted for a few days.  Had a MRI of his cervical and thoracic area.  Has already had a brain and lumbar MRI within the past few months for the symptoms.  Patient asking for a shower chair to help prevent falls in the shower.  Patient wanted discuss his blood work, we discussed the 2 values that were listed as abnormal but that they were unconcerning, specifically his low CK and his nRBC  Patient continues to have issues with weakness below the right knee.  He has an upcoming EMG scheduled on May 1 followed by an appointment with the neurosurgeon.  He feels like the neuropathy is now appearing in his left foot occasionally and the right side it is progressing upwards to his mid thigh.  No neuropathy or weakness in the hands or upper extremities.  Patient notes that when he is not sitting down on his gluteus he is able to move his lower extremity.  The ASCVD Risk score (Arnett DK, et al., 2019) failed to calculate for the following reasons:   Risk score cannot be calculated because patient has a medical history suggesting prior/existing ASCVD  Health Maintenance Due  Topic Date Due   FOOT EXAM  Never done   OPHTHALMOLOGY EXAM  Never done   Diabetic kidney evaluation - Urine ACR  Never done   DTaP/Tdap/Td (1 - Tdap) Never done   Lung Cancer Screening  Never done   Zoster Vaccines- Shingrix (1 of 2) Never done   COVID-19 Vaccine (1 - 2024-25 season) Never done      Objective:     BP 124/77   Pulse 60   Ht 5\' 6"  (1.676 m)   Wt 147 lb 12.8 oz (67 kg)   SpO2 97%   BMI 23.86 kg/m    Physical Exam General: Alert,  oriented CV: Regular rhythm Pulmonary: See bilaterally MSK: Weakness of the quadricep muscle on the right side.  Weakness improves when patient elevates himself off the chair from a seating position and supports himself with his upper extremities.  Weakness returns if he is sitting on the chair and his foot is passively elevated off the ground.   No results found for any visits on 10/03/23.      Assessment & Plan:   Gait abnormality -     For home use only DME Shower stool  Weakness of right lower extremity Assessment & Plan: Patient has EMG scheduled for May 1.  He currently ambulates with a walker.  Has had more falls recently.  Requesting tub transfer chair.  Patient would benefit due to his fall risk.  Orders: -     For home use only DME Shower stool  Vitamin B12 deficiency Assessment & Plan: Patient on oral B12 5000 mcg and has had improvement in his B12 levels.  Given the progression of his neuropathy and weakness, we will also send in folic acid  and thiamine supplementation.  Will check B6 level before repeating that since excessive levels can cause neuropathy themselves.   Other orders -     Vitamin B-1; Take 1 tablet (100  mg total) by mouth daily.  Dispense: 90 tablet; Refill: 1 -     Folic Acid ; Take 1 tablet (1 mg total) by mouth daily.  Dispense: 90 tablet; Refill: 1     Return for already scheduled.    Laneta Pintos, MD

## 2023-10-03 NOTE — Patient Instructions (Addendum)
 It was nice to see you today,  We addressed the following topics today: -I think it would be a good idea to also take folic acid and thiamine which are also B vitamins. - I am also going to send in an order for a tub transfer chair to help you get in and out of the shower. - Follow-up with me at your next visit already scheduled and we can check some other levels including B6 level.   Have a great day,  Etha Henle, MD

## 2023-10-03 NOTE — Assessment & Plan Note (Addendum)
 Patient has EMG scheduled for May 1.  He currently ambulates with a walker.  Has had more falls recently.  Requesting tub transfer chair.  Patient would benefit due to his fall risk.

## 2023-10-04 DIAGNOSIS — R202 Paresthesia of skin: Secondary | ICD-10-CM | POA: Diagnosis not present

## 2023-10-04 DIAGNOSIS — R29898 Other symptoms and signs involving the musculoskeletal system: Secondary | ICD-10-CM | POA: Diagnosis not present

## 2023-10-17 ENCOUNTER — Ambulatory Visit: Admitting: Neurosurgery

## 2023-10-18 ENCOUNTER — Encounter: Payer: Self-pay | Admitting: Family Medicine

## 2023-10-18 ENCOUNTER — Telehealth: Payer: Self-pay

## 2023-10-18 ENCOUNTER — Ambulatory Visit (INDEPENDENT_AMBULATORY_CARE_PROVIDER_SITE_OTHER): Payer: Medicaid Other | Admitting: Family Medicine

## 2023-10-18 VITALS — BP 122/77 | HR 58 | Ht 66.0 in | Wt 151.1 lb

## 2023-10-18 DIAGNOSIS — E785 Hyperlipidemia, unspecified: Secondary | ICD-10-CM

## 2023-10-18 DIAGNOSIS — R29898 Other symptoms and signs involving the musculoskeletal system: Secondary | ICD-10-CM

## 2023-10-18 DIAGNOSIS — B356 Tinea cruris: Secondary | ICD-10-CM | POA: Diagnosis not present

## 2023-10-18 NOTE — Assessment & Plan Note (Signed)
 Improving with antifungal cream.

## 2023-10-18 NOTE — Assessment & Plan Note (Signed)
 Slightly better with sensation of physical therapy.  He received a bill for over $1200 for physical therapy and is unsure why.  He went to 4 sessions before stopping.  Has Medicaid and it was ordered by his specialist so it should be covered.  Advised him to talk to his Medicaid representative regarding this.  Has EMG upcoming Thursday of this week.

## 2023-10-18 NOTE — Patient Instructions (Addendum)
 It was nice to see you today,  We addressed the following topics today: -We will check your B6 and cholesterol level today.  I will let you know the results when I get them - Continue your current medications. - I will reach out to the radiologist department to find out if they can read the CT scan -I will look into getting the diagnosis of diabetes removed.  Have a great day,  Etha Henle, MD

## 2023-10-18 NOTE — Assessment & Plan Note (Signed)
 Tolerating the Crestor .  Will recheck today.

## 2023-10-18 NOTE — Telephone Encounter (Signed)
 Called Radiology the tech stated that she will push it through so it can get read

## 2023-10-18 NOTE — Progress Notes (Signed)
   Established Patient Office Visit  Subjective   Patient ID: Alex Turner, male    DOB: [birth year]  Age: 53 y.o. MRN: [id number]  Chief Complaint  Patient presents with   Medical Management of Chronic Issues    HPI  Subjective: -Patient states that his leg weakness and pain is slightly improved since stopping physical therapy stated it was worse when he was doing physical therapy for it.  He is now ambulating with the cane instead of the walker. - Concerned about sinus bradycardia on EKG with pulse rate of 49 - Using tub transfer chair successfully that was prescribed at last visit. - Using prescribed cream for tinea cruris with good effect - Started Crestor  medication 2 months ago, no issues reported - Using nicotine  patches, reduced smoking significantly (approximately 1 pack since starting patches) - Has not received CT chest results yet - EMG/nerve conduction study scheduled for 10/20/2023 - Received bill from physical therapy ($1200 for 4 visits) - Questions about ASCVD risk score noted in previous visit  Past medical history: - Pre-diabetes (borderline) when weight was 250 lbs.  States he has never been diagnosed with diabetes - Low B12 levels - Possible TIA in past (documentation unclear)  Medications: - Cholesterol medication started in February 2025 - Nicotine  patches - Thiamine supplement - Folic acid  supplement - B12 supplement  Social history: - Tobacco use: significantly reduced smoking with nicotine  patches    The ASCVD Risk score (Arnett DK, et al., 2019) failed to calculate for the following reasons:   Risk score cannot be calculated because patient has a medical history suggesting prior/existing ASCVD  Health Maintenance Due  Topic Date Due   FOOT EXAM  Never done   OPHTHALMOLOGY EXAM  Never done   Diabetic kidney evaluation - Urine ACR  Never done   DTaP/Tdap/Td (1 - Tdap) Never done   Zoster Vaccines- Shingrix (1 of 2) Never done   COVID-19  Vaccine (1 - 2024-25 season) Never done      Objective:     BP 122/77   Pulse (!) 58   Ht 5\' 6"  (1.676 m)   Wt 151 lb 1.3 oz (68.5 kg)   SpO2 97%   BMI 24.38 kg/m    Physical Exam General: Alert, oriented Pulmonary: No respiratory distress Psych: Pleasant affect MSK: Ambulating with a cane.     Assessment & Plan:   Hyperlipidemia, unspecified hyperlipidemia type Assessment & Plan: Tolerating the Crestor .  Will recheck today.  Orders: -     Lipid panel  Tinea cruris Assessment & Plan: Improving with antifungal cream.   Weakness of right lower extremity Assessment & Plan: Slightly better with sensation of physical therapy.  He received a bill for over $1200 for physical therapy and is unsure why.  He went to 4 sessions before stopping.  Has Medicaid and it was ordered by his specialist so it should be covered.  Advised him to talk to his Medicaid representative regarding this.  Has EMG upcoming Thursday of this week.  Orders: -     Vitamin B6     Return in about 3 months (around 01/17/2024) for hld.    Laneta Pintos, MD

## 2023-10-19 ENCOUNTER — Other Ambulatory Visit: Payer: Self-pay | Admitting: Family Medicine

## 2023-10-19 LAB — LIPID PANEL
Chol/HDL Ratio: 4.7 ratio (ref 0.0–5.0)
Cholesterol, Total: 174 mg/dL (ref 100–199)
HDL: 37 mg/dL — ABNORMAL LOW (ref >39)
LDL Chol Calc (NIH): 118 mg/dL — ABNORMAL HIGH (ref 0–99)
Triglycerides: 105 mg/dL (ref 0–149)
VLDL Cholesterol Cal: 19 mg/dL (ref 5–40)

## 2023-10-19 MED ORDER — ROSUVASTATIN CALCIUM 10 MG PO TABS: 10.0000 mg | ORAL_TABLET | Freq: Every day | ORAL | 1 refills | Status: DC

## 2023-10-20 ENCOUNTER — Ambulatory Visit (INDEPENDENT_AMBULATORY_CARE_PROVIDER_SITE_OTHER): Admitting: Neurology

## 2023-10-20 ENCOUNTER — Encounter: Payer: Self-pay | Admitting: Family Medicine

## 2023-10-20 ENCOUNTER — Encounter: Payer: Self-pay | Admitting: Neurology

## 2023-10-20 VITALS — BP 140/84 | HR 53 | Ht 66.0 in | Wt 151.0 lb

## 2023-10-20 DIAGNOSIS — R200 Anesthesia of skin: Secondary | ICD-10-CM | POA: Diagnosis not present

## 2023-10-20 DIAGNOSIS — M21371 Foot drop, right foot: Secondary | ICD-10-CM

## 2023-10-20 DIAGNOSIS — G5730 Lesion of lateral popliteal nerve, unspecified lower limb: Secondary | ICD-10-CM | POA: Diagnosis not present

## 2023-10-20 DIAGNOSIS — R29898 Other symptoms and signs involving the musculoskeletal system: Secondary | ICD-10-CM

## 2023-10-20 DIAGNOSIS — M5416 Radiculopathy, lumbar region: Secondary | ICD-10-CM

## 2023-10-20 NOTE — Telephone Encounter (Signed)
 Ideally would have all info

## 2023-10-20 NOTE — Patient Instructions (Signed)
 Myelogram  A myelogram is an imaging test of the spinal cord and the places where nerves attach to the spinal cord (nerve roots). A dye (contrast material) is injected into the spine before the X-ray. This provides a clearer image for your health care provider. You may need this test if you have a spinal cord problem that cannot be diagnosed with other imaging tests, such as a CT scan or an MRI. You may also have this test to check your spine after surgery or to check for a leak of the fluid that surrounds and protects the spinal cord (cerebrospinalfluid, or CSF). Tell a health care provider about: Any allergies you have, especially to iodine. All medicines you are taking, including vitamins, herbs, eye drops, creams, and over-the-counter medicines. Any problems you or family members have had with anesthesia or contrast materials. Any bleeding problems you have. Any surgeries you have had. Any medical conditions you have or have had, including asthma. Whether you are pregnant or may be pregnant. What are the risks? Your health care provider will talk with you about risks. These may include: Infection. Bleeding. Allergic reactions to medicines or contrast materials. Damage to your spinal cord or nerves. Loss or leaking of CSF. This can lead to headaches. Damage to kidneys. Seizures. This is rare. What happens before the procedure? Follow instructions from your health care provider about what you may eat and drink. You may be asked to drink more fluids. If you will be going home right after the procedure, plan to have a responsible adult: Take you home from the hospital or clinic. You will not be allowed to drive. Care for you for the time you are told. Medicines Ask your health care provider about: Changing or stopping your regular medicines. This is especially important if you are taking diabetes medicines or blood thinners. Taking medicines such as aspirin  and ibuprofen . These  medicines can thin your blood. Do not take these medicines unless your health care provider tells you to take them. Taking over-the-counter medicines, vitamins, herbs, and supplements. What happens during the procedure? You will lie face-down on a table. Your health care provider will locate the best injection site on your spine. This is most often in the lower back. The area of injection will be washed with germ-killing soap. You will be given anesthesia. This will numb the area. Your health care provider will insert a long needle into the space around your spinal cord (subarachnoid space). A sample of CSF may be taken and sent to the lab for testing. The contrast material will be injected into the subarachnoid space. The exam table may be tilted to help the contrast material flow up or down your spine. Images of your spinal cord will be taken with X-ray for your health care provider to examine. A bandage (dressing) may be placed over the injection site. The procedure may vary among health care providers and hospitals. What can I expect after the procedure? Your blood pressure, heart rate, breathing rate, and blood oxygen level may be monitored until you leave the hospital or clinic. You may have: Soreness at your injection site. A mild headache. You will be asked to lie down with your head raised (elevated). This reduces the risk of a headache. It is up to you to get the results of your procedure. Ask your health care provider, or the department that is doing the procedure, when your results will be ready. Follow these instructions at home: Rest as told by your  health care provider. Lie flat with your head slightly elevated to reduce the risk of a headache. Do not bend, lift, or do hard work for 24-48 hours, or as told by your health care provider. Take over-the-counter and prescription medicines only as told by your health care provider. Take care of your dressing as told by your health  care provider. Drink enough fluid to keep your pee (urine) pale yellow. Bathe or shower as told by your health care provider. Contact a health care provider if: You have new symptoms or your symptoms get worse. You have a headache that lasts longer than 24 hours. You have a fever. You feel nauseous or vomit. You have a stiff neck or have numbness in your legs. You develop a rash, itching, or sneezing. Get help right away if: You have a seizure. You have trouble breathing. You are unable to urinate or have a bowel movement. These symptoms may be an emergency. Get help right away. Call 911. Do not wait to see if the symptoms will go away. Do not drive yourself to the hospital. This information is not intended to replace advice given to you by your health care provider. Make sure you discuss any questions you have with your health care provider. Document Revised: 12/30/2021 Document Reviewed: 12/30/2021 Elsevier Patient Education  2024 [employer].

## 2023-10-22 ENCOUNTER — Other Ambulatory Visit: Payer: Self-pay | Admitting: Family Medicine

## 2023-10-22 MED ORDER — POTASSIUM CITRATE ER 10 MEQ (1080 MG) PO TBCR: 20.0000 meq | EXTENDED_RELEASE_TABLET | Freq: Two times a day (BID) | ORAL | 1 refills | Status: AC

## 2023-10-24 ENCOUNTER — Telehealth: Payer: Self-pay | Admitting: Neurology

## 2023-10-24 NOTE — Progress Notes (Signed)
 I discussed findings with patient of EMG nerve conduction study as below.  Patient reports progressive weakness in the right leg with numbness and low back pain and radicular symptoms.  Patient has right leg foot drop, weakness in eversion and inversion which would not be consistent with peroneal mononeuropathy.  EMG nerve conduction study also consistent with L5/S1 localization as opposed to peroneal mononeuropathy or other neuropathy.  However there was no acute ongoing denervation and MRI of the lumbar spine as below did not show nerve impingement.  CT myelogram could be a more sensitive study than the MRI.  Normal sensory conductions would also argue against mononeuropathy, polyneuropathy or plexopathy to account for his symptoms.  I discussed with patient and he agreed to CT myelogram.  There were chronic neurogenic changes in distal muscles innervated by L5 and S1 nerve roots along with delayed or absent right F waves and normal sensory conductions consistent with right L5/S1 radiculopathy.  Acute/ongoing denervation was not seen however and MRI of the lumbar spine did not show nerve root compression at this level so recommend CT myelogram; patient has been under the care of primary care, neurology and neurosurgery and completed physical therapy.  Orders Placed This Encounter  Procedures   DG FL GUIDED LP INJECT MYELOGRAM LUMBAR   DG Myelogram Lumbar   I spent over 20 minutes of face-to-face and non-face-to-face time with patient on the  1. Right leg weakness   2. Peroneal neuropathy, unspecified laterality   3. Foot drop, right   4. Right leg numbness   5. Lumbar radiculopathy, chronic    diagnosis.  This included previsit chart review, lab review, study review, order entry, electronic health record documentation, patient education on the different diagnostic and therapeutic options, counseling and coordination of care, risks and benefits of management, compliance, or risk factor reduction.   This does not include time spent on EMG nerve conduction study.

## 2023-10-24 NOTE — Telephone Encounter (Signed)
Yes please update thank you.

## 2023-10-24 NOTE — Progress Notes (Signed)
 Full Name: Alex Turner Gender: Male MRN #: [id number] Date of Birth: 06/28/1971    Visit Date: 10/20/2023 08:54 Age: 53 Years  History: Low back pain with radiation in to the lower extremities and right foot drop.  Summary: EMG/NCS performed on the right lower extremity.  The right tibial F wave showed delayed latency (57.8 ms, normal less than 56) and the right peroneal F wave showed no response.  All remaining nerves (as indicated in the following tables) were within normal limits.  The right tibialis anterior showed polyphasic motor units and diminished motor unit recruitment.  The right tibialis posterior showed increased motor unit amplitude and diminished motor unit recruitment.  Patient could not activate the right peroneus longus muscle.  The right gastrocnemius muscle showed increased motor unit amplitude, polyphasic motor units and diminished motor unit recruitment.  The right extensor hallucis longus showed increased motor unit amplitude, polyphasic motor units and diminished motor unit recruitment.  All remaining muscles (as indicated in the following tables) were within normal limits.    Conclusion: There were chronic neurogenic changes in distal muscles innervated by L5 and S1 nerve roots along with delayed or absent right F waves and normal sensory conductions consistent with right L5/S1 radiculopathy.  Acute/ongoing denervation was not seen however and MRI of the lumbar spine did not show nerve root compression at this level so recommend CT myelogram; patient has been under the care of primary care, neurology and neurosurgery and completed physical therapy.  Orders Placed This Encounter  Procedures   DG FL GUIDED LP INJECT MYELOGRAM LUMBAR   DG Myelogram Lumbar    ------------------------------- Aldona Amel, M.D.  [facility] [address] [place], [place] [address] Tel: [phone] Fax: [phone]  Verbal informed consent was obtained  from the patient, patient was informed of potential risk of procedure, including bruising, bleeding, hematoma formation, infection, muscle weakness, muscle pain, numbness, among others.        MNC    Nerve / Sites Muscle Latency Ref. Amplitude Ref. Rel Amp Segments Distance Velocity Ref. Area    ms ms mV mV %  cm m/s m/s mVms  R Peroneal - EDB     Ankle EDB 5.1 <=6.5 2.8 >=2.0 100 Ankle - EDB 9   10.2     Fib head EDB 13.4  2.9  103 Fib head - Ankle 38 46 >=44 11.0     Pop fossa EDB 15.4  2.8  96 Pop fossa - Fib head 10 50 >=44 10.9         Pop fossa - Ankle      R Tibial - AH     Ankle AH 5.5 <=5.8 7.3 >=4.0 100 Ankle - AH 11   20.4     Pop fossa AH 15.0  6.0  82.3 Pop fossa - Ankle 40 42 >=41 16.4         SNC    Nerve / Sites Rec. Site Peak Lat Ref.  Amp Ref. Segments Distance    ms ms V V  cm  R Sural - Ankle (Calf) (1)     Calf Ankle 4.2 <=4.4 6 >=6 Calf - Ankle 14  R Superficial peroneal - Ankle     Lat leg Ankle 2.6 <=4.4 10 >=6 Lat leg - Ankle 14         F  Wave    Nerve F Lat Ref.   ms ms  R Tibial -  AH 57.8 <=56.0  R Peroneal - EDB NR <=56.0         EMG Summary Table    Spontaneous MUAP Recruitment  Muscle IA Fib PSW Fasc Other Amp Dur. Poly Pattern  R. Vastus medialis Normal None None None _______ Normal Normal Normal Normal  R. Tibialis anterior Normal None None None _______ Normal Normal 2+ Reduced  R. Tibialis posterior Normal None None None _______ Increased Normal Normal Reduced  R. Peroneus longus Normal None None None _______      R. Gastrocnemius (Medial head) Normal None None None _______ Increased Normal 2+ Reduced  R. Abductor hallucis Normal None None None _______ Normal Normal Normal Normal  R. Extensor hallucis longus Normal None None None _______ Increased Normal 2+ Reduced  R. Biceps femoris (long head) Normal None None None _______ Normal Normal Normal Normal  R. Gluteus maximus Normal None None None _______ Normal Normal Normal Normal  R.  Gluteus medius Normal None None None _______ Normal Normal Normal Normal  R. Lumbar paraspinals (low) Normal None None None _______ Normal Normal Normal Normal  R. Iliopsoas Normal None None None _______ Normal Normal Normal Normal

## 2023-10-24 NOTE — Telephone Encounter (Signed)
  Please see above message from GI regarding DG MYELOGRAPHY LUMBAR INJ LUMBOSACRAL.  Was this meant to be ordered as a CT? Or do you want a CT scan done as well as LP?

## 2023-10-24 NOTE — Telephone Encounter (Signed)
 Relayed to [place] @ [facility].

## 2023-10-26 ENCOUNTER — Ambulatory Visit: Admitting: Neurosurgery

## 2023-10-27 ENCOUNTER — Encounter: Admitting: Neurology

## 2023-11-01 ENCOUNTER — Encounter: Payer: Self-pay | Admitting: Neurology

## 2023-11-02 ENCOUNTER — Other Ambulatory Visit: Payer: Self-pay | Admitting: Family Medicine

## 2023-11-02 MED ORDER — NICOTINE 14 MG/24HR TD PT24: 14.0000 mg | MEDICATED_PATCH | Freq: Every day | TRANSDERMAL | 0 refills | Status: AC

## 2023-11-02 NOTE — Discharge Instructions (Signed)

## 2023-11-03 ENCOUNTER — Other Ambulatory Visit

## 2023-11-03 ENCOUNTER — Inpatient Hospital Stay
Admission: RE | Admit: 2023-11-03 | Discharge: 2023-11-03 | Disposition: A | Discharge status: Home / Self Care | Source: Ambulatory Visit | Attending: Neurology | Admitting: Neurology

## 2023-11-09 ENCOUNTER — Ambulatory Visit: Admitting: Neurosurgery

## 2023-11-09 NOTE — Telephone Encounter (Signed)
 The myelogram and CT are scheduled for May 28 but his insurance is giving us  issues approving the CT scan. I have sent them 46 pages of notes, labs and MRI results twice. Can you do a peer to peer? The phone number is [phone] and the tracking number is [id number] Thank you

## 2023-11-10 ENCOUNTER — Other Ambulatory Visit

## 2023-11-10 ENCOUNTER — Inpatient Hospital Stay: Admission: RE | Admit: 2023-11-10 | Source: Ambulatory Visit

## 2023-11-10 NOTE — Telephone Encounter (Signed)
 I was able to get this approved myself so we are good to go.  Alex Turner: [id number] exp. 11/01/2023-12/31/2023

## 2023-11-10 NOTE — Telephone Encounter (Signed)
 THANK YOU

## 2023-11-15 NOTE — Discharge Instructions (Signed)

## 2023-11-16 ENCOUNTER — Ambulatory Visit
Admission: RE | Admit: 2023-11-16 | Discharge: 2023-11-16 | Disposition: A | Discharge status: Home / Self Care | Source: Ambulatory Visit | Attending: Neurology | Admitting: Neurology

## 2023-11-16 DIAGNOSIS — G5730 Lesion of lateral popliteal nerve, unspecified lower limb: Secondary | ICD-10-CM

## 2023-11-16 DIAGNOSIS — M21371 Foot drop, right foot: Secondary | ICD-10-CM

## 2023-11-16 DIAGNOSIS — M5416 Radiculopathy, lumbar region: Secondary | ICD-10-CM

## 2023-11-16 DIAGNOSIS — R29898 Other symptoms and signs involving the musculoskeletal system: Secondary | ICD-10-CM

## 2023-11-16 DIAGNOSIS — R200 Anesthesia of skin: Secondary | ICD-10-CM

## 2023-11-16 MED ORDER — ONDANSETRON HCL 4 MG/2ML IJ SOLN: 4.0000 mg | Freq: Once | INTRAMUSCULAR | Status: DC | PRN

## 2023-11-16 MED ORDER — MEPERIDINE HCL 50 MG/ML IJ SOLN: 50.0000 mg | Freq: Once | INTRAMUSCULAR | Status: DC | PRN

## 2023-11-16 MED ORDER — DIAZEPAM 5 MG PO TABS
10.0000 mg | ORAL_TABLET | Freq: Once | ORAL | Status: AC
  Administered 2023-11-16: 5 mg via ORAL

## 2023-11-16 MED ORDER — IOPAMIDOL (ISOVUE-M 200) INJECTION 41%
20.0000 mL | Freq: Once | INTRAMUSCULAR | Status: AC
Start: 2023-11-16 — End: 2023-11-16
  Administered 2023-11-16: 20 mL via INTRATHECAL

## 2023-11-21 ENCOUNTER — Encounter: Payer: Self-pay | Admitting: Neurosurgery

## 2023-11-21 ENCOUNTER — Telehealth: Payer: Self-pay | Admitting: Neurology

## 2023-11-21 ENCOUNTER — Ambulatory Visit: Payer: Self-pay | Admitting: Neurology

## 2023-11-21 ENCOUNTER — Ambulatory Visit (INDEPENDENT_AMBULATORY_CARE_PROVIDER_SITE_OTHER): Payer: MEDICAID | Admitting: Neurosurgery

## 2023-11-21 ENCOUNTER — Ambulatory Visit
Admission: RE | Admit: 2023-11-21 | Discharge: 2023-11-21 | Disposition: A | Discharge status: Home / Self Care | Payer: MEDICAID | Source: Ambulatory Visit | Attending: Neurosurgery | Admitting: Neurosurgery

## 2023-11-21 VITALS — BP 142/82 | Ht 66.0 in | Wt 151.0 lb

## 2023-11-21 DIAGNOSIS — M4716 Other spondylosis with myelopathy, lumbar region: Secondary | ICD-10-CM

## 2023-11-21 NOTE — Progress Notes (Signed)
 I had a follow-up in clinic today with Alex Turner.  He is following up after a EMG and CT myelogram.  EMG demonstrated a lumbar radiculopathy rather than a peroneal neuropathy.  This was however chronic without active denervation.  His MRI did not show any clear evidence of compression so a CT myelogram was ordered.  It showed some disc changes but no severe compression of the involved nerve roots.  In regards to his lower extremity strength he has not noticed any significant changes.  He states that he does like to walk on his heel, on physical examination he does have some plantarflexion and dorsiflexion present, difficult to ascertain on his confrontational examination to give it a formal grading scale but it does have clear movement.  He does have some mild spondylolisthesis, this was noticed on his CT myelogram.  I will plan on getting flexion-extension films to evaluate for any hypermobility.  If there is any hypermobility potential that this could be causing some compression of the nerve roots that are traversing.  At this point I do not see a clear area of compression to his L5 or S1 nerve roots that would be impacted on a decompression at this time.  Is likely that he had a disc herniation and that it has since improved radiographically, but continues to have nerve weakness as nerves are very slow to recover.  Will follow-up after his flexion-extension x-rays, at this point do not see a clear target for decompression.

## 2023-11-21 NOTE — Telephone Encounter (Signed)
 [name], The CT Myelogram of your low back does NOT show a current pinched nerve. It is likely though that you had a pinched nerve, often times a disk in the back can herniate but then over weeks it can repair itself. This is the only thing I can think of that cause this issue but I do want to see you back in the office to follow up and make sure nothing is getting worse. I will ask my office to call and schedule a follow up thank you  Pod 4: make him a follow up in 3-6 months  Dr. Felipe Horton, I don;t know what else to make of these findings, just fyi thank you

## 2023-11-28 ENCOUNTER — Ambulatory Visit: Payer: Self-pay | Admitting: Neurosurgery

## 2023-11-30 ENCOUNTER — Telehealth: Payer: Self-pay

## 2023-11-30 NOTE — Telephone Encounter (Signed)
 Left message with Father to have pt call back to schedule appt with Dr. Arabella Beach to address his concerns.

## 2023-12-29 ENCOUNTER — Ambulatory Visit: Payer: Self-pay

## 2023-12-29 ENCOUNTER — Other Ambulatory Visit: Payer: Self-pay | Admitting: Family Medicine

## 2023-12-29 MED ORDER — EPINEPHRINE 0.3 MG/0.3ML IJ SOAJ: 0.3000 mg | INTRAMUSCULAR | 1 refills | Status: DC | PRN

## 2023-12-29 NOTE — Telephone Encounter (Signed)
 Epi pen sent to [facility]

## 2023-12-29 NOTE — Telephone Encounter (Signed)
 FYI Only or Action Required?: Action required by provider: requesting epipen  be called into pharmacy.  Patient was last seen in primary care on 10/18/2023 by Chandra Toribio POUR, MD.  Called Nurse Triage reporting Insect Bite.  Symptoms began yesterday.  Interventions attempted: OTC medications: benadryl.  Symptoms are: stable.  Triage Disposition: No disposition on file.  Patient/caregiver understands and will follow disposition?:     Copied from CRM [id number]. Topic: Clinical - Red Word Triage >> Dec 29, 2023 10:04 AM Winona R wrote: Possible allergic creation to Yellow jacket. He was stung yesterday and now having some swelling, he wants to know if he can get a eppi pen ordered just in case. Reason for Disposition  Normal local reaction to bee, wasp, or yellow jacket sting  Answer Assessment - Initial Assessment Questions 1. TYPE: What type of sting was it? (e.g., bee, yellow jacket, unknown)      Yellow jacket 2. ONSET: When did it occur?      yesterday 3. LOCATION: Where is the sting located?  How many stings?     Right chest 4. SWELLING SIZE: How big is the swelling? (e.g., inches or cm)     moderate 5. REDNESS: Is the area red or pink? If Yes, ask: What size is area of redness? (e.g., inches or cm). When did the redness start?     yes 6. PAIN: Is there any pain? If Yes, ask: How bad is it?  (Scale 0-10; or none, mild, moderate, severe)     6/10 7. ITCHING: Is there any itching? If Yes, ask: How bad is it?      yes 8. RESPIRATORY DISTRESS: Describe your breathing.     no 9. PRIOR REACTIONS: Have you had any severe allergic reactions to stings in the past? If Yes, ask: What happened?     Yes, itching and tightness in throat 10. OTHER SYMPTOMS: Do you have any other symptoms? (e.g., abdomen pain, face or tongue swelling, new rash elsewhere, vomiting)       no 11. PREGNANCY: Is there any chance you are pregnant? When was your last menstrual  period?       na  Protocols used: Bee or Yellow Jacket Sting-A-AH

## 2024-01-25 ENCOUNTER — Encounter: Payer: Self-pay | Admitting: Family Medicine

## 2024-01-25 ENCOUNTER — Ambulatory Visit (INDEPENDENT_AMBULATORY_CARE_PROVIDER_SITE_OTHER): Payer: MEDICAID | Admitting: Family Medicine

## 2024-01-25 VITALS — BP 121/78 | HR 61 | Ht 66.0 in | Wt 154.0 lb

## 2024-01-25 DIAGNOSIS — E785 Hyperlipidemia, unspecified: Secondary | ICD-10-CM

## 2024-01-25 DIAGNOSIS — E782 Mixed hyperlipidemia: Secondary | ICD-10-CM | POA: Diagnosis not present

## 2024-01-25 DIAGNOSIS — G589 Mononeuropathy, unspecified: Secondary | ICD-10-CM | POA: Diagnosis not present

## 2024-01-25 DIAGNOSIS — E083299 Diabetes mellitus due to underlying condition with mild nonproliferative diabetic retinopathy without macular edema, unspecified eye: Secondary | ICD-10-CM

## 2024-01-25 DIAGNOSIS — E559 Vitamin D deficiency, unspecified: Secondary | ICD-10-CM

## 2024-01-25 DIAGNOSIS — E538 Deficiency of other specified B group vitamins: Secondary | ICD-10-CM

## 2024-01-25 DIAGNOSIS — Z23 Encounter for immunization: Secondary | ICD-10-CM | POA: Diagnosis not present

## 2024-01-25 DIAGNOSIS — M4802 Spinal stenosis, cervical region: Secondary | ICD-10-CM

## 2024-01-25 DIAGNOSIS — G992 Myelopathy in diseases classified elsewhere: Secondary | ICD-10-CM

## 2024-01-25 DIAGNOSIS — J439 Emphysema, unspecified: Secondary | ICD-10-CM

## 2024-01-25 LAB — POCT GLYCOSYLATED HEMOGLOBIN (HGB A1C): HbA1c POC (<> result, manual entry): 5.5 % (ref 4.0–5.6)

## 2024-01-25 MED ORDER — OXYCODONE-ACETAMINOPHEN 5-325 MG PO TABS: 1.0000 | ORAL_TABLET | Freq: Four times a day (QID) | ORAL | 0 refills | Status: AC | PRN

## 2024-01-25 MED ORDER — STIOLTO RESPIMAT 2.5-2.5 MCG/ACT IN AERS: 2.0000 | INHALATION_SPRAY | Freq: Every day | RESPIRATORY_TRACT | 5 refills | Status: DC

## 2024-01-25 NOTE — Progress Notes (Unsigned)
 Established Patient Office Visit  Subjective   Patient ID: Alex Turner, male    DOB: 08-25-1970  Age: 53 y.o. MRN: [id number]  No chief complaint on file.   HPI  Subjective - Reports recent improvement in mobility, now walking without a cane after experiencing spontaneous movement in toes while lying down. Can walk, but not for long periods due to significant pain in the hip area. - Denies back pain, states pain is localized to the hip. - Reports jolting pain down the left leg and recent onset of numbness from the neck over to one side after turning the neck. - Discussed a previous CT scan from April showing emphysema. Reports symptoms of cough and feeling of needing to clear lungs, but without production. - Reports smoking 1-2 packs per week, decreased from previous amounts. - Discussed a history of being told he was pre-diabetic due to weight, but never diagnosed with diabetes or prescribed medication for it. Reports A1c is 5.5. Requests the diagnosis of diabetes be removed from his chart and changed to pre-diabetes. - Inquires about follow-up for neck issues and was advised to contact the neurosurgeon, Dr. Claudene. - Reports a nerve conduction study found a dead nerve in the calf, which may be contributing to tingling. Notes the neurosurgeon also mentioned a nerve in the calf was not responding. The EMG results reportedly indicated the issue was originating more from the back than the leg. Tingling started in November of last year. - Inquires if bone spurs in the hip and knee could be pinching the sciatic nerve. - Requests more pain medication, as the prescription from the hospital is running low. Uses it sparingly for severe back pain that prevents sleep.  Medications Currently taking a cholesterol medication, ibuprofen  as needed for pain, and a muscle relaxant from a previous hospital visit as needed. Takes vitamin B12 and vitamin D  supplements.  PMH, PSH, FH, Social Hx PMH:  Herniated disc (per neurosurgeon), emphysema, pre-diabetes, kidney stone (not passed, but not painful), bone spurs in hip and knee, history of drug use. Social Hx: Smokes 1-2 packs of cigarettes per week. Has Medicaid.  ROS Constitutional: Denies fever. Reports poor appetite at times. Respiratory: Reports cough and shortness of breath. Neurological: Reports tingling from the hip down into the foot, jolting pain down the left leg, and numbness in the neck area.  Objective General: Walking without a cane.  Assessment and Plan 1. 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 Also has new numbness in the neck region. Now ambulating without a cane but limited by hip pain. History of herniated disc and EMG findings suggestive of a lumbar source, though a non-responsive nerve was noted in the calf. - Renew prescription for pain medication. - Continue ibuprofen  and muscle relaxants as needed. - Advised to avoid positions that put pressure on nerves, such as crossing legs. - Advised to follow up with neurosurgeon (Dr. Claudene) for ongoing nerve-related symptoms in back, legs, and neck.  2. Emphysema: Confirmed via CT scan in April. Reports symptoms of cough and shortness of breath. Currently smoking 1-2 packs per week. - Counseled on the importance of smoking cessation to prevent disease progression. - Prescribe a daily inhaler for symptomatic relief. Provided instructions on proper inhaler use.  3. History of Pre-diabetes: A1c is 5.5. Was previously told he was pre-diabetic when his weight was higher. Never  treated with medication for diabetes. - Will request to have the diagnosis of diabetes removed from the chart and changed to pre-diabetes.  4. Health Maintenance / Follow-up: - Labs ordered: Lipid panel, vitamin D , vitamin B12, CMP (for kidney and liver  function). - Continue vitamin D  and B12 supplements.    The ASCVD Risk score (Arnett DK, et al., 2019) failed to calculate for the following reasons:   Risk score cannot be calculated because patient has a medical history suggesting prior/existing ASCVD  Health Maintenance Due  Topic Date Due   FOOT EXAM  Never done   OPHTHALMOLOGY EXAM  Never done   Diabetic kidney evaluation - Urine ACR  Never done   DTaP/Tdap/Td (1 - Tdap) Never done   Hepatitis B Vaccines (1 of 3 - 19+ 3-dose series) Never done   Zoster Vaccines- Shingrix  (1 of 2) Never done   COVID-19 Vaccine (1 - 2024-25 season) Never done   INFLUENZA VACCINE  01/20/2024   HEMOGLOBIN A1C  01/22/2024   Colonoscopy  03/28/2024      Objective:     There were no vitals taken for this visit. {Vitals History (Optional):23777}  Physical Exam   No results found for any visits on 01/25/24.      Assessment & Plan:   There are no diagnoses linked to this encounter.   No follow-ups on file.    Toribio MARLA Slain, MD

## 2024-01-25 NOTE — Patient Instructions (Signed)
 It was nice to see you today,  We addressed the following topics today: -I have sent you in a daily inhaler to help with your symptoms from COPD - If you are confused on how to use it, the manufacture makes videos showing you how to use it.  You can find them on YouTube or the manufactures website. - I will let you know the results of your labs when I get them - If you have any more issues with your lower back or neck, Dr. Claudene would be the person to talk to you about it.   Have a great day,  Rolan Slain, MD

## 2024-01-26 ENCOUNTER — Ambulatory Visit: Payer: Self-pay | Admitting: Family Medicine

## 2024-01-26 ENCOUNTER — Other Ambulatory Visit: Payer: Self-pay | Admitting: Family Medicine

## 2024-01-26 LAB — LIPID PANEL
Chol/HDL Ratio: 5.1 ratio — ABNORMAL HIGH (ref 0.0–5.0)
Cholesterol, Total: 179 mg/dL (ref 100–199)
HDL: 35 mg/dL — ABNORMAL LOW (ref >39)
LDL Chol Calc (NIH): 122 mg/dL — ABNORMAL HIGH (ref 0–99)
Triglycerides: 119 mg/dL (ref 0–149)
VLDL Cholesterol Cal: 22 mg/dL (ref 5–40)

## 2024-01-26 LAB — B12 AND FOLATE PANEL
Folate: 8.4 ng/mL (ref >3.0)
Vitamin B-12: 316 pg/mL (ref 232–1245)

## 2024-01-26 LAB — MICROALBUMIN / CREATININE URINE RATIO
Creatinine, Urine: 73 mg/dL
Microalb/Creat Ratio: 6 mg/g{creat} (ref 0–29)
Microalbumin, Urine: 4.6 ug/mL

## 2024-01-26 LAB — COMPREHENSIVE METABOLIC PANEL WITH GFR
ALT: 8 IU/L (ref 0–44)
AST: 13 IU/L (ref 0–40)
Albumin: 4.5 g/dL (ref 3.8–4.9)
Alkaline Phosphatase: 81 IU/L (ref 44–121)
BUN/Creatinine Ratio: 7 — ABNORMAL LOW (ref 9–20)
BUN: 6 mg/dL (ref 6–24)
Bilirubin Total: 0.3 mg/dL (ref 0.0–1.2)
CO2: 22 mmol/L (ref 20–29)
Calcium: 9.5 mg/dL (ref 8.7–10.2)
Chloride: 103 mmol/L (ref 96–106)
Creatinine, Ser: 0.92 mg/dL (ref 0.76–1.27)
Globulin, Total: 2.4 g/dL (ref 1.5–4.5)
Glucose: 84 mg/dL (ref 70–99)
Potassium: 4.2 mmol/L (ref 3.5–5.2)
Sodium: 138 mmol/L (ref 134–144)
Total Protein: 6.9 g/dL (ref 6.0–8.5)
eGFR: 99 mL/min/1.73 (ref >59)

## 2024-01-26 LAB — VITAMIN D 25 HYDROXY (VIT D DEFICIENCY, FRACTURES): Vit D, 25-Hydroxy: 30.8 ng/mL (ref 30.0–100.0)

## 2024-01-26 MED ORDER — ROSUVASTATIN CALCIUM 20 MG PO TABS: 20.0000 mg | ORAL_TABLET | Freq: Every day | ORAL | 1 refills | Status: AC

## 2024-01-26 NOTE — Telephone Encounter (Signed)
 Tried calling patient; no voicemail available. I sent patient a MyChart message.

## 2024-01-27 DIAGNOSIS — J439 Emphysema, unspecified: Secondary | ICD-10-CM | POA: Insufficient documentation

## 2024-01-27 NOTE — Assessment & Plan Note (Signed)
 Pt arrived today ambulating without walker or cane. Stated he started to regain sensation and movement in the toes one night spontaneously and this has continued to improve.  Has been seeing both neurology and neurosurgery but has not received any new treatments that could have contributed to this improvement.

## 2024-01-27 NOTE — Assessment & Plan Note (Signed)
 Confirmed via CT scan in April. Reports symptoms of cough and shortness of breath. Currently smoking 1-2 packs per week. - Counseled on the importance of smoking cessation to prevent disease progression. - Prescribe a daily inhaler for symptomatic relief. Provided instructions on proper inhaler use.

## 2024-01-27 NOTE — Assessment & Plan Note (Signed)
 A1c is 5.5. Was previously told he was pre-diabetic when his weight was higher. Never treated with medication for diabetes. He has no recorded A1c's in the diabetic range.  - Will request to have the diagnosis of diabetes removed from the chart and changed to pre-diabetes.

## 2024-01-27 NOTE — Assessment & Plan Note (Signed)
 Continues to have occasional flare ups of radiculopathy.  Most recently having left sided neck and face paresthesia after rotating his neck.  No weakness.  Advised him to continue f/u with neurosurgery regarding cervical and lumbar radiculopathy issues.

## 2024-01-27 NOTE — Assessment & Plan Note (Signed)
>>  ASSESSMENT AND PLAN FOR MONONEUROPATHY WRITTEN ON 01/27/2024  9:28 AM BY CHANDRA TORIBIO POUR, MD  Pt arrived today ambulating without walker or cane. Stated he started to regain sensation and movement in the toes one night spontaneously and this has continued to improve.  Has been seeing both neurology and neurosurgery but has not received any new treatments that could have contributed to this improvement.

## 2024-03-09 ENCOUNTER — Encounter: Payer: Self-pay | Admitting: Neurosurgery

## 2024-03-09 ENCOUNTER — Ambulatory Visit: Payer: MEDICAID | Admitting: Neurosurgery

## 2024-03-09 VITALS — BP 130/62 | Ht 66.0 in | Wt 149.0 lb

## 2024-03-09 DIAGNOSIS — R200 Anesthesia of skin: Secondary | ICD-10-CM

## 2024-03-09 DIAGNOSIS — R202 Paresthesia of skin: Secondary | ICD-10-CM | POA: Diagnosis not present
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
## 2024-03-09 NOTE — Progress Notes (Deleted)
 Left face and left arm with numnbess, persistent.   Month.   Tingling on left.    Off balance.     Neck pain .   Low back pain,   Left eye pain, nausea.    Sponatenous recovery of his prior leg weakness, now walking without a cane.

## 2024-03-09 NOTE — Addendum Note (Signed)
 Addended by: Nissa Stannard W on: 03/09/2024 10:51 AM   Modules accepted: Orders

## 2024-03-22 NOTE — Telephone Encounter (Signed)
 Pt called wanting to r/s his upcoming appt due to provider not being available.

## 2024-03-22 NOTE — Telephone Encounter (Signed)
 Spoke with patient and rescheduled appt with Dr. Vear for 04/24/24 at 9:30am

## 2024-03-27 ENCOUNTER — Telehealth: Payer: Self-pay

## 2024-03-27 NOTE — Telephone Encounter (Signed)
 Copied from CRM [id number]. Topic: Referral - Status >> Mar 27, 2024  4:07 PM Amy B wrote: Reason for CRM:  Melene with [facility] states they received a referral for this patient but his insurance is out of network.

## 2024-04-11 ENCOUNTER — Ambulatory Visit: Admitting: Neurology

## 2024-04-11 ENCOUNTER — Encounter: Payer: Self-pay | Admitting: Family Medicine

## 2024-04-24 ENCOUNTER — Ambulatory Visit: Payer: MEDICAID | Admitting: Neurology

## 2024-04-24 ENCOUNTER — Encounter: Payer: Self-pay | Admitting: Neurology

## 2024-04-24 VITALS — BP 115/75 | HR 74 | Ht 66.0 in | Wt 144.5 lb

## 2024-04-24 DIAGNOSIS — R200 Anesthesia of skin: Secondary | ICD-10-CM

## 2024-04-24 DIAGNOSIS — R29898 Other symptoms and signs involving the musculoskeletal system: Secondary | ICD-10-CM | POA: Diagnosis not present

## 2024-04-24 DIAGNOSIS — M5431 Sciatica, right side: Secondary | ICD-10-CM

## 2024-04-24 DIAGNOSIS — G5701 Lesion of sciatic nerve, right lower limb: Secondary | ICD-10-CM

## 2024-04-24 MED ORDER — BETAMETHASONE SOD PHOS & ACET 6 (3-3) MG/ML IJ SUSP
12.0000 mg | Freq: Once | INTRAMUSCULAR | Status: AC
  Administered 2024-04-24: 12 mg via INTRAMUSCULAR
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
## 2024-05-07 ENCOUNTER — Other Ambulatory Visit: Payer: Self-pay | Admitting: Neurology

## 2024-05-07 ENCOUNTER — Encounter: Payer: Self-pay | Admitting: Neurology

## 2024-05-07 DIAGNOSIS — G992 Myelopathy in diseases classified elsewhere: Secondary | ICD-10-CM

## 2024-05-07 MED ORDER — GABAPENTIN 300 MG PO CAPS: 300.0000 mg | ORAL_CAPSULE | Freq: Three times a day (TID) | ORAL | 3 refills | Status: AC

## 2024-05-07 MED ORDER — METHYLPREDNISOLONE 4 MG PO TABS: ORAL_TABLET | ORAL | 0 refills | Status: DC

## 2024-05-07 NOTE — Telephone Encounter (Signed)
 I called patient around 8:45 PM.  Unfortunately, the phone rang but there was no answer or voicemail  Because of the symptoms, concerning for myelopathy, I was going to get additional details and recommend that he go to the emergency room if there has been significant progression over the last couple of days.  I did send a MyChart message to him and we will ask our nurse to contact him tomorrow.  Additionally I sent in a prescription for a steroid pack and gabapentin  to help with some of the symptoms.

## 2024-05-08 ENCOUNTER — Emergency Department (HOSPITAL_COMMUNITY): Payer: MEDICAID

## 2024-05-08 ENCOUNTER — Emergency Department (HOSPITAL_COMMUNITY)
Admission: EM | Admit: 2024-05-08 | Discharge: 2024-05-08 | Disposition: A | Discharge status: Home / Self Care | Payer: MEDICAID | Attending: Emergency Medicine | Admitting: Emergency Medicine

## 2024-05-08 ENCOUNTER — Encounter (HOSPITAL_COMMUNITY): Payer: Self-pay

## 2024-05-08 DIAGNOSIS — R0789 Other chest pain: Secondary | ICD-10-CM | POA: Insufficient documentation

## 2024-05-08 DIAGNOSIS — Z79899 Other long term (current) drug therapy: Secondary | ICD-10-CM | POA: Insufficient documentation

## 2024-05-08 DIAGNOSIS — E119 Type 2 diabetes mellitus without complications: Secondary | ICD-10-CM | POA: Insufficient documentation

## 2024-05-08 DIAGNOSIS — G959 Disease of spinal cord, unspecified: Secondary | ICD-10-CM | POA: Diagnosis not present

## 2024-05-08 LAB — COMPREHENSIVE METABOLIC PANEL WITH GFR
ALT: 9 U/L (ref 0–44)
AST: 16 U/L (ref 15–41)
Albumin: 4.1 g/dL (ref 3.5–5.0)
Alkaline Phosphatase: 55 U/L (ref 38–126)
Anion gap: 12 (ref 5–15)
BUN: 9 mg/dL (ref 6–20)
CO2: 24 mmol/L (ref 22–32)
Calcium: 9.4 mg/dL (ref 8.9–10.3)
Chloride: 102 mmol/L (ref 98–111)
Creatinine, Ser: 0.85 mg/dL (ref 0.61–1.24)
GFR, Estimated: >60 mL/min (ref >60)
Glucose, Bld: 99 mg/dL (ref 70–99)
Potassium: 4 mmol/L (ref 3.5–5.1)
Sodium: 138 mmol/L (ref 135–145)
Total Bilirubin: 0.6 mg/dL (ref 0.0–1.2)
Total Protein: 6.9 g/dL (ref 6.5–8.1)

## 2024-05-08 LAB — VITAMIN B12: Vitamin B-12: 205 pg/mL (ref 180–914)

## 2024-05-08 LAB — CBC
HCT: 47.2 % (ref 39.0–52.0)
Hemoglobin: 15.5 g/dL (ref 13.0–17.0)
MCH: 28.1 pg (ref 26.0–34.0)
MCHC: 32.8 g/dL (ref 30.0–36.0)
MCV: 85.7 fL (ref 80.0–100.0)
Platelets: 232 K/uL (ref 150–400)
RBC: 5.51 MIL/uL (ref 4.22–5.81)
RDW: 13.7 % (ref 11.5–15.5)
WBC: 7.2 K/uL (ref 4.0–10.5)
nRBC: 0 % (ref 0.0–0.2)

## 2024-05-08 LAB — TROPONIN I (HIGH SENSITIVITY)
Troponin I (High Sensitivity): <2 ng/L (ref <18)
Troponin I (High Sensitivity): 2 ng/L (ref <18)

## 2024-05-08 MED ORDER — OXYCODONE-ACETAMINOPHEN 5-325 MG PO TABS
1.0000 | ORAL_TABLET | Freq: Once | ORAL | Status: AC
  Administered 2024-05-08: 1 via ORAL
  Filled 2024-05-08: qty 1

## 2024-05-08 MED ORDER — GADOBUTROL 1 MMOL/ML IV SOLN
6.5000 mL | Freq: Once | INTRAVENOUS | Status: AC | PRN
  Administered 2024-05-08: 6.5 mL via INTRAVENOUS

## 2024-05-08 MED ORDER — OXYCODONE-ACETAMINOPHEN 5-325 MG PO TABS: 1.0000 | ORAL_TABLET | Freq: Four times a day (QID) | ORAL | 0 refills | Status: DC | PRN

## 2024-05-08 MED ORDER — GABAPENTIN 300 MG PO CAPS
300.0000 mg | ORAL_CAPSULE | Freq: Once | ORAL | Status: AC
  Administered 2024-05-08: 300 mg via ORAL
  Filled 2024-05-08: qty 1

## 2024-05-08 NOTE — Discharge Instructions (Addendum)
 Please use Tylenol or ibuprofen for pain.  You may use 600 mg ibuprofen every 6 hours or 1000 mg of Tylenol every 6 hours.  You may choose to alternate between the 2.  This would be most effective.  Not to exceed 4 g of Tylenol within 24 hours.  Not to exceed 3200 mg ibuprofen 24 hours.  You can use the stronger narcotic pain medication in place of Tylenol for severe break through pain.  If you take the narcotic pain medication that we prescribed recommend that you also take a laxative such as MiraLAX or Dulcolax every day that you take the narcotic pain medicine, and drink plenty of fluids, 50 to 64 ounces to prevent any constipation.

## 2024-05-08 NOTE — ED Provider Notes (Signed)
 [place] [facility]ERGENCY DEPARTMENT AT [facility] Provider Note   CSN: [id number] Arrival date & time: 05/08/24  9043     Patient presents with: No chief complaint on file.   Alex Turner is a 53 y.o. male with past medical history significant for diabetes, TIA, schizoaffective disorder, PTSD, tobacco abuse, cannabis use, mononeuropathy, B12 deficiency, cervical spinal stenosis with myelopathy, lumbar spinal stenosis with myelopathy who presents concern for neck, thoracic back pain, burning sensation across his chest wall with some mild shortness of breath.  He also endorses some ongoing pain and weakness of bilateral lower extremities.  He reports that he has had progressive weakness and loss of sensation of his right leg especially.  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   HPI     Prior to Admission medications   Medication Sig Start Date End Date Taking? Authorizing Provider  oxyCODONE -acetaminophen  (PERCOCET/ROXICET) 5-325 MG tablet Take 1 tablet by mouth every 6 (six) hours as needed for severe pain (pain score 7-10). 05/08/24  Yes Breannah Kratt H, PA-C  acetaminophen  (TYLENOL ) 325 MG tablet Take 2 tablets (650 mg total) by mouth every 6 (six) hours. 12/04/21   Sebastian Alm, MD  cholecalciferol  (CHOLECALCIFEROL ) 25 MCG tablet Take 1 tablet (1,000 Units total) by mouth daily. 10/01/23   Josette Ade, MD  Cyanocobalamin  5000 MCG TBDP Take 1 tablet by mouth daily. 07/26/23   Chandra Toribio POUR, MD  EPINEPHrine  0.3 mg/0.3 mL IJ SOAJ injection Inject 0.3 mg into the muscle as needed for anaphylaxis. 12/29/23   Chandra Toribio POUR, MD  feeding supplement (ENSURE ENLIVE / ENSURE PLUS) LIQD Take 237 mLs by mouth 2 (two) times daily between meals. 09/30/23   Josette Ade, MD  folic acid  (FOLVITE ) 1 MG tablet Take 1 tablet (1 mg total) by mouth  daily. 10/03/23   Chandra Toribio POUR, MD  gabapentin  (NEURONTIN ) 300 MG capsule Take 1 capsule (300 mg total) by mouth 3 (three) times daily. 05/07/24   Sater, Ade LABOR, MD  ketoconazole 2%-triamcinolone  0.1% 1:2 cream mixture Apply topically 2 (two) times daily. 08/18/23   Chandra Toribio POUR, MD  methocarbamol  (ROBAXIN ) 500 MG tablet Take 1 tablet (500 mg total) by mouth every 8 (eight) hours as needed for muscle spasms. 09/30/23   Josette Ade, MD  methylPREDNISolone  (MEDROL ) 4 MG tablet Taper from 6 pills po for one day to 1 pill po the last day over 6 days 05/07/24   Sater, Ade LABOR, MD  nicotine  (NICODERM CQ  - DOSED IN MG/24 HOURS) 14 mg/24hr patch Place 1 patch (14 mg total) onto the skin daily. 11/02/23   Chandra Toribio POUR, MD  potassium citrate  (UROCIT-K ) 10 MEQ (1080 MG) SR tablet Take 2 tablets (20 mEq total) by mouth in the morning and at bedtime. 10/22/23   Chandra Toribio POUR, MD  rosuvastatin  (CRESTOR ) 20 MG tablet Take 1 tablet (20 mg total) by mouth daily. 01/26/24   Chandra Toribio POUR, MD  thiamine (VITAMIN B-1) 100 MG tablet Take 1 tablet (100 mg total) by mouth daily. 10/03/23   Chandra Toribio POUR, MD  Tiotropium Bromide-Olodaterol (STIOLTO RESPIMAT ) 2.5-2.5 MCG/ACT AERS Inhale 2 puffs into the lungs daily. 01/25/24   Chandra Toribio POUR, MD    Allergies: Bee venom, Tape, Penicillins, and Wellbutrin [bupropion hcl]    Review of Systems  All other systems reviewed and are negative.  Updated Vital Signs BP 97/61   Pulse (!) 58   Temp 97.9 F (36.6 C) (Oral)   Resp 16   SpO2 100%   Physical Exam Vitals and nursing note reviewed.  Constitutional:      General: He is not in acute distress.    Appearance: Normal appearance.  HENT:     Head: Normocephalic and atraumatic.  Eyes:     General:        Right eye: No discharge.        Left eye: No discharge.  Cardiovascular:     Rate and Rhythm: Normal rate and regular rhythm.     Pulses: Normal pulses.     Heart sounds: No murmur heard.    No  friction rub. No gallop.     Comments: DP, PT pulses are 2+ in bilateral lower extremities, radial, ulnar pulses are 2+ in bilateral upper extremities. Pulmonary:     Effort: Pulmonary effort is normal.     Breath sounds: Normal breath sounds.  Abdominal:     General: Bowel sounds are normal.     Palpations: Abdomen is soft.  Musculoskeletal:     Comments: Mildly decreased strength to flexion, extension at level of the right knee, right ankle. Intact at level of right hip, left hip and below. He endorses significant decreased sensation of the right lower extremity below the level of the knee.  Normal strength 5/5 of bilateral upper and lower extremities.  Skin:    General: Skin is warm and dry.     Capillary Refill: Capillary refill takes less than 2 seconds.  Neurological:     Mental Status: He is alert and oriented to person, place, and time.  Psychiatric:        Mood and Affect: Mood normal.        Behavior: Behavior normal.     (all labs ordered are listed, but only abnormal results are displayed) Labs Reviewed  CBC  COMPREHENSIVE METABOLIC PANEL WITH GFR  VITAMIN B12  TROPONIN I (HIGH SENSITIVITY)  TROPONIN I (HIGH SENSITIVITY)    EKG: EKG Interpretation Date/Time:  Tuesday May 08 2024 11:09:29 EST Ventricular Rate:  61 PR Interval:  150 QRS Duration:  98 QT Interval:  376 QTC Calculation: 378 R Axis:   87  Text Interpretation: Normal sinus rhythm Normal ECG When compared with ECG of 28-Sep-2023 20:20, No significant change since last tracing Confirmed by Randol Simmonds [phone]) on 05/08/2024 3:25:05 PM  Radiology: MR Brain W and Wo Contrast Result Date: 05/08/2024 EXAM: MRI BRAIN WITH AND WITHOUT CONTRAST 05/08/2024 06:56:08 PM TECHNIQUE: Multiplanar multisequence MRI of the head/brain was performed with and without the administration of 6.5 mL gadobutrol  (GADAVIST ) 1 MMOL/ML intravenous contrast. COMPARISON: MRI head dated 05/27/2023. CLINICAL HISTORY: Headache,  neuro deficit. FINDINGS: BRAIN AND VENTRICLES: No acute infarct. No acute intracranial hemorrhage. No mass effect or midline shift. No hydrocephalus. The sella is unremarkable. Normal flow voids. No mass. No abnormal intracranial enhancement. ORBITS: No acute abnormality. SINUSES: No acute abnormality. BONES AND SOFT TISSUES: Normal bone marrow signal and enhancement. No acute soft tissue abnormality. IMPRESSION: 1. No acute intracranial abnormality. 2. No abnormal enhancement. Electronically signed by: Donnice Mania MD 05/08/2024 07:31 PM EST RP Workstation: [id number]   MR Cervical Spine W and Wo Contrast Result Date: 05/08/2024 EXAM: MRI CERVICAL SPINE WITH AND WITHOUT CONTRAST 05/08/2024 07:02:05 PM TECHNIQUE: Multiplanar multisequence MRI of the cervical spine was performed without and with the administration of intravenous contrast. COMPARISON: None available.  CLINICAL HISTORY: Myelopathy, acute, cervical spine Myelopathy, acute, cervical spine FINDINGS: BONES AND ALIGNMENT: Normal alignment. Normal vertebral body heights. Marrow signal is unremarkable. No abnormal enhancement. SPINAL CORD: Normal spinal cord size. Normal spinal cord signal. SOFT TISSUES: No paraspinal mass. C2-C3: No significant disc herniation. No spinal canal stenosis or neural foraminal narrowing. C3-C4: Small posterior disc osteophyte complex. No spinal canal stenosis or neural foraminal narrowing. C4-C5: No significant disc herniation. No spinal canal stenosis or neural foraminal narrowing. C5-C6: Small posterior disc osteophyte complex. Right greater than left facet and uncovertebral hypertrophy. Severe right and mild left foraminal stenosis. Mild to moderate canal stenosis. C6-C7: Bilateral facet and uncovertebral hypertrophy. Small posterior disc osteophyte complex. Moderate bilateral foraminal stenosis and mild canal stenosis. C7-T1: No significant disc herniation. No spinal canal stenosis or neural foraminal narrowing.  IMPRESSION: 1. At C5-C6, severe right and mild left foraminal stenosis. Mild to moderate canal stenosis. 2. At C6-C7, moderate bilateral foraminal stenosis and mild canal stenosis. Electronically signed by: Gilmore Molt MD 05/08/2024 07:30 PM EST RP Workstation: [id number]   DG Chest 2 View Result Date: 05/08/2024 CLINICAL DATA:  Shortness of breath EXAM: CHEST - 2 VIEW COMPARISON:  Chest CT dated 09/21/2023. FINDINGS: No focal consolidation, pleural effusion, pneumothorax. The cardiac silhouette is within normal limits. No acute osseous pathology. IMPRESSION: No active cardiopulmonary disease. Electronically Signed   By: Vanetta Chou M.D.   On: 05/08/2024 11:42     Procedures   Medications Ordered in the ED  oxyCODONE -acetaminophen  (PERCOCET/ROXICET) 5-325 MG per tablet 1 tablet (1 tablet Oral Given 05/08/24 1601)  gabapentin  (NEURONTIN ) capsule 300 mg (300 mg Oral Given 05/08/24 1602)  gadobutrol  (GADAVIST ) 1 MMOL/ML injection 6.5 mL (6.5 mLs Intravenous Contrast Given 05/08/24 1848)                                    Medical Decision Making Amount and/or Complexity of Data Reviewed Radiology: ordered.  Risk Prescription drug management.   This patient is a 53 y.o. male  who presents to the ED for concern of chest pain, back pain, weakness of lower extremities.   Differential diagnoses prior to evaluation: The emergent differential diagnosis includes, but is not limited to,  CVA, spinal cord injury, ACS, arrhythmia, syncope, orthostatic hypotension, sepsis, hypoglycemia, hypoxia, electrolyte disturbance, endocrine disorder, anemia, environmental exposure, polypharmacy, ACS, AAS, PE, Mallory-Weiss, Boerhaave's, Pneumonia, acute bronchitis, asthma or COPD exacerbation, anxiety, MSK pain or traumatic injury to the chest, acid reflux versus other . This is not an exhaustive differential.   Past Medical History / Co-morbidities / Social History: diabetes, TIA, schizoaffective  disorder, PTSD, tobacco abuse, cannabis use, mononeuropathy, B12 deficiency, cervical spinal stenosis with myelopathy, lumbar spinal stenosis with myelopathy  Additional history: Chart reviewed. Pertinent results include: Extensive reviewed outpatient neurology visits, most recent plan for MRI brain, MRI C-spine with and without contrast, reviewed CT lumbar spine from May of this year which showed L2-3 disc protrusion, foraminal narrowing, L3-4 broad-based disc protrusion and foraminal narrowing, slight retrolisthesis at L2-3 is exaggerated on extension.  Physical Exam: Physical exam performed. The pertinent findings include: Mildly decreased strength to flexion, extension at level of the right knee, right ankle. Intact at level of right hip, left hip and below. He endorses significant decreased sensation of the right lower extremity below the level of the knee.  Normal strength 5/5 of bilateral upper and lower extremities.   DP, PT pulses are  2+ in bilateral lower extremities, radial, ulnar pulses are 2+ in bilateral upper extremities.  Lab Tests/Imaging studies: I personally interpreted labs/imaging and the pertinent results include: CBC unremarkable, CMP unremarkable, normal vitamin B12, negative troponin x 2 I independently interpreted plain film chest x-ray which shows no evidence of acute intrathoracic abnormality.  MRI brain, C-spine shows foraminal narrowing, spinal stenosis without any spinal cord abnormality, acute stroke, or intracranial lesion.  Findings consistent with his known cervical myelopathy with no significant changes today. I agree with the radiologist interpretation.  Cardiac monitoring: EKG obtained and interpreted by myself and attending physician which shows: Normal sinus rhythm, no significant change from last tracing   Medications: I ordered medication including gabapentin , Percocet for pain.  I have reviewed the patients home medicines and have made adjustments as  needed.   Disposition: After consideration of the diagnostic results and the patients response to treatment, I feel that patient stable for discharge, will provide short course of pain medicine and encourage close follow-up with his neurologist.   emergency department workup does not suggest an emergent condition requiring admission or immediate intervention beyond what has been performed at this time. The plan is: as above. The patient is safe for discharge and has been instructed to return immediately for worsening symptoms, change in symptoms or any other concerns.   Final diagnoses:  Cervical myelopathy (HCC)  Atypical chest pain    ED Discharge Orders          Ordered    oxyCODONE -acetaminophen  (PERCOCET/ROXICET) 5-325 MG tablet  Every 6 hours PRN        05/08/24 1939               Cullen Lahaie H, PA-C 05/08/24 ULYESS Randol Simmonds, MD 05/09/24 1229

## 2024-05-08 NOTE — ED Triage Notes (Signed)
 Pt c/o right foot pain for 6 months, has seen neurology and gave hima steroid shot. Now endorses spine pain and chest pan that is burning with SHOB. Hx of emphysema

## 2024-05-08 NOTE — ED Provider Triage Note (Signed)
 Emergency Medicine Provider Triage Evaluation Note  YOMAR MEJORADO , a 53 y.o. male  was evaluated in triage.  Pt complains of R foot pain and loss of motor control of RLE for 6 months that then resolved. States that he then saw neurology who did a piriformis issue and did a steroid shot.  Now states he has spine pain and chest pain that is burning. Some SOB (he states I have emphasema)    Review of Systems  Positive: Pain Negative: Fever   Physical Exam  BP 138/85   Pulse 64   Temp 97.9 F (36.6 C) (Oral)   Resp 20   SpO2 98%  Gen:   Awake, no distress   Resp:  Normal effort  MSK:   Moves extremities without difficulty Other:  Decreased sensation in BL legs, movement nml  Medical Decision Making  Medically screening exam initiated at 10:55 AM.  Appropriate orders placed.  Alm CHRISTELLA Saras was informed that the remainder of the evaluation will be completed by another provider, this initial triage assessment does not replace that evaluation, and the importance of remaining in the ED until their evaluation is complete.  [address]   [place], [place] 05/08/24 1059

## 2024-05-09 ENCOUNTER — Ambulatory Visit: Payer: Self-pay

## 2024-05-09 ENCOUNTER — Other Ambulatory Visit: Payer: Self-pay | Admitting: *Deleted

## 2024-05-09 ENCOUNTER — Telehealth: Payer: Self-pay | Admitting: Neurology

## 2024-05-09 DIAGNOSIS — M21371 Foot drop, right foot: Secondary | ICD-10-CM

## 2024-05-09 DIAGNOSIS — R29898 Other symptoms and signs involving the musculoskeletal system: Secondary | ICD-10-CM

## 2024-05-09 DIAGNOSIS — G5730 Lesion of lateral popliteal nerve, unspecified lower limb: Secondary | ICD-10-CM

## 2024-05-09 DIAGNOSIS — R200 Anesthesia of skin: Secondary | ICD-10-CM

## 2024-05-09 DIAGNOSIS — M4802 Spinal stenosis, cervical region: Secondary | ICD-10-CM

## 2024-05-09 DIAGNOSIS — M5431 Sciatica, right side: Secondary | ICD-10-CM

## 2024-05-09 DIAGNOSIS — G5701 Lesion of sciatic nerve, right lower limb: Secondary | ICD-10-CM

## 2024-05-09 DIAGNOSIS — G5702 Lesion of sciatic nerve, left lower limb: Secondary | ICD-10-CM

## 2024-05-09 DIAGNOSIS — M5416 Radiculopathy, lumbar region: Secondary | ICD-10-CM

## 2024-05-09 NOTE — Telephone Encounter (Signed)
 Pt called to request for MD  or Nurse respond to  Pt MyChart . Pt states that he would like to know what to do next, Because he is in a lot of Pain

## 2024-05-09 NOTE — Telephone Encounter (Signed)
 This RN made first attempt to contact pt.    Copied from CRM #[id number]. Topic: Clinical - Red Word Triage >> May 09, 2024  8:32 AM Alex Turner wrote: Red Word that prompted transfer to Nurse Triage: Neck pain, burning sensation. Discharged from hospital yesterday. >> May 09, 2024  8:48 AM Alex Turner wrote: Holding for 10 mins, pt would like a call back

## 2024-05-09 NOTE — Telephone Encounter (Signed)
 He went to the ED and got the cervical MRI done yesterday. I will cancel our order.

## 2024-05-10 ENCOUNTER — Encounter: Payer: Self-pay | Admitting: Family Medicine

## 2024-05-10 ENCOUNTER — Telehealth: Payer: Self-pay | Admitting: Neurology

## 2024-05-10 NOTE — Telephone Encounter (Signed)
 Referral to Neurology fax to [facility]  [facility] Phone: [phone] Fax: [phone]

## 2024-05-15 ENCOUNTER — Other Ambulatory Visit: Payer: Self-pay | Admitting: Family Medicine

## 2024-05-15 MED ORDER — OXYCODONE-ACETAMINOPHEN 5-325 MG PO TABS: 1.0000 | ORAL_TABLET | Freq: Four times a day (QID) | ORAL | 0 refills | Status: AC | PRN

## 2024-05-21 ENCOUNTER — Ambulatory Visit: Payer: MEDICAID | Admitting: Family Medicine

## 2024-05-21 ENCOUNTER — Encounter: Payer: Self-pay | Admitting: Family Medicine

## 2024-05-21 VITALS — BP 123/66 | HR 65 | Ht 66.0 in | Wt 141.0 lb

## 2024-05-21 DIAGNOSIS — E538 Deficiency of other specified B group vitamins: Secondary | ICD-10-CM

## 2024-05-21 DIAGNOSIS — R202 Paresthesia of skin: Secondary | ICD-10-CM | POA: Diagnosis not present

## 2024-05-21 DIAGNOSIS — E083299 Diabetes mellitus due to underlying condition with mild nonproliferative diabetic retinopathy without macular edema, unspecified eye: Secondary | ICD-10-CM | POA: Diagnosis not present

## 2024-05-21 DIAGNOSIS — G629 Polyneuropathy, unspecified: Secondary | ICD-10-CM | POA: Diagnosis not present

## 2024-05-21 DIAGNOSIS — Z23 Encounter for immunization: Secondary | ICD-10-CM

## 2024-05-21 DIAGNOSIS — M129 Arthropathy, unspecified: Secondary | ICD-10-CM

## 2024-05-21 MED ORDER — STIOLTO RESPIMAT 2.5-2.5 MCG/ACT IN AERS: 2.0000 | INHALATION_SPRAY | Freq: Every day | RESPIRATORY_TRACT | 5 refills | Status: AC

## 2024-05-21 MED ORDER — EPINEPHRINE 0.3 MG/0.3ML IJ SOAJ: 0.3000 mg | INTRAMUSCULAR | 1 refills | Status: AC | PRN

## 2024-05-21 NOTE — Progress Notes (Unsigned)
 Established Patient Office Visit  Subjective   Patient ID: Alex Turner, male    DOB: [birth year]  Age: 53 y.o. MRN: [id number]  Chief Complaint  Patient presents with  . Hospitalization Follow-up    HPI  Subjective - Recurrence of neurological symptoms. Reports tingling has spread from the right leg up into the hip. Left leg has now started with symptoms. Reports burning sensation around the trunk up into the shoulder blades. Hands are also involved. Joints are more painful. Burning sensation is now bilateral and sporadic. Reports using a walker due to leg weakness and poor balance. Previously was using a cane, and for a period from June/July to recently, was not using any assistive device. Reports fatigue and poor sleep.  - Reports dry, itchy eyes for years, feeling like sandpaper. Nose is initially runny in the mornings then becomes dry for the rest of the day. Lips are constantly dry. Reports worsening vision.  - Reports urinary urgency and some incontinence.  - Reports lightheadedness on standing but no syncope. Heart rate increases on standing then slows.  Medications Current medications include gabapentin . A 5-day refill of a pain medication was recently filled. Reports inhaler and EpiPen  prescription were not received. EpiPen  is for allergy to bees, yellow jackets, and wasps.  PMH, PSH, FH, Social Hx PMHx: Mono at age 58. Cervical stenosis, severe at C5-C6 on the right and moderate on the left. Received a piriformis steroid injection with numbing agent from neurology with only transient relief for 2 hours. FHx: Grandmother and mother with Sjogren's syndrome. Sister with Hashimoto's. Uncle with an unspecified autoimmune issue. Social Hx: Discussed applying for disability through the Social Security Administration due to inability to work for over a year.  ROS Pertinent Positives: Tingling in bilateral lower extremities and hands, burning sensation in back, joint pain,  dry/itchy eyes, worsening vision, nasal dryness/rhinorrhea, dry lips, urinary urgency/incontinence, fatigue, poor sleep, lightheadedness. Pertinent Negatives: No syncope.  Objective Vitals: BP 137/x. General: Appears fatigued. Neuro: Using a walker for ambulation due to weakness and imbalance.  Assessment and Plan Recurrent, widespread neurological symptoms - Presents with return of widespread symptoms including tingling in bilateral legs and hands, and a burning sensation in the back. Symptoms are migratory and intermittent. Previous workup including MRI of the brain and cervical spine shows no change, with known C5-C6 stenosis which is unlikely to be the cause of these widespread symptoms. Family history is significant for autoimmune disease, including Sjogren's syndrome. New symptoms include sicca-like symptoms (dry eyes, mouth, nose) and urinary urgency. Plan is to investigate for an underlying autoimmune condition. - Lab work for autoimmune panel to be ordered. - Continue gabapentin . - Discussed possibility of starting medications for autoimmune conditions after labs are resulted. - Reorder inhaler and EpiPen . - Discussed Shingrix  vaccine. Patient had significant fatigue after first dose but agrees to proceed with second dose.     The ASCVD Risk score (Arnett DK, et al., 2019) failed to calculate for the following reasons:   Risk score cannot be calculated because patient has a medical history suggesting prior/existing ASCVD  Health Maintenance Due  Topic Date Due  . FOOT EXAM  Never done  . OPHTHALMOLOGY EXAM  Never done  . Hepatitis B Vaccines 19-59 Average Risk (1 of 3 - 19+ 3-dose series) Never done  . Influenza Vaccine  01/20/2024  . COVID-19 Vaccine (1 - 2025-26 season) Never done  . Zoster Vaccines- Shingrix  (2 of 2) 03/21/2024  . Colonoscopy  03/28/2024  Objective:     BP 123/66   Pulse 65   Ht 5' 6 (1.676 m)   Wt 141 lb (64 kg)   SpO2 95%   BMI 22.76  kg/m  {Vitals History (Optional):23777}  Physical Exam   No results found for any visits on 05/21/24.      Assessment & Plan:   There are no diagnoses linked to this encounter.   No follow-ups on file.    Toribio MARLA Slain, MD

## 2024-05-21 NOTE — Patient Instructions (Signed)
 It was nice to see you today,  We addressed the following topics today: - I will order labs to check for autoimmune conditions. You can have these drawn any weekday morning before 11:30 AM. If you cannot get them done today, please schedule an appointment to come back soon. The sooner we get the results, the sooner we can consider specific treatments. - I am sending in prescriptions for your inhaler and the EpiPen .  - We discussed applying for disability. You will need to start the process through the Social Security Administration office. They will likely request medical information from me, which I can provide. Be prepared for a potentially long process that may require appeals. - We will proceed with the second shingles shot. Expect that you might feel unwell and fatigued for a couple of days, similar to the first dose.  Have a great day,  Rolan Slain, MD

## 2024-05-22 ENCOUNTER — Other Ambulatory Visit: Payer: MEDICAID

## 2024-05-22 NOTE — Assessment & Plan Note (Signed)
-   Presents with return of widespread symptoms including tingling in bilateral legs and hands, and a burning sensation in the back. Previously had all of these symptoms last year, had an extensive neurologic workup including mri of entire spine, nerve conduction studies, but no obvious cause other than some mild areas of stenosis on mri.  These all resolved spontaneously earlier this year and during his last visit the pt was walking with minimal use of cane.   Family history is significant for autoimmune disease, including Sjogren's syndrome. Pt now mentions symptoms that include sicca-like symptoms (dry eyes, mouth, nose) and urinary urgency. Plan is to investigate for an underlying autoimmune condition. - Lab work for autoimmune panel to be ordered. - Continue gabapentin . - Discussed possibility of starting medications for autoimmune conditions after labs are resulted. - Discussed Shingrix  vaccine. Patient had significant fatigue after first dose but agrees to proceed with second dose.

## 2024-05-23 ENCOUNTER — Ambulatory Visit: Payer: Self-pay | Admitting: Family Medicine

## 2024-05-24 ENCOUNTER — Other Ambulatory Visit: Payer: Self-pay | Admitting: Family Medicine

## 2024-05-24 LAB — ANA W/REFLEX IF POSITIVE: Anti Nuclear Antibody (ANA): NEGATIVE

## 2024-05-24 LAB — SYPHILIS: RPR W/REFLEX TO RPR TITER AND TREPONEMAL ANTIBODIES, TRADITIONAL SCREENING AND DIAGNOSIS ALGORITHM: RPR Ser Ql: NONREACTIVE

## 2024-05-24 LAB — SJOGREN'S SYNDROME ANTIBODS(SSA + SSB)
ENA SSA (RO) Ab: 0.3 AI (ref 0.0–0.9)
ENA SSB (LA) Ab: <0.2 AI (ref 0.0–0.9)

## 2024-05-24 LAB — HEMOGLOBIN A1C
Est. average glucose Bld gHb Est-mCnc: 114 mg/dL
Hgb A1c MFr Bld: 5.6 % (ref 4.8–5.6)

## 2024-05-24 LAB — HOMOCYSTEINE: Homocysteine: 20.1 umol/L — ABNORMAL HIGH (ref 0.0–14.5)

## 2024-05-24 LAB — RHEUMATOID FACTOR: Rheumatoid fact SerPl-aCnc: <10 [IU]/mL

## 2024-05-24 LAB — SEDIMENTATION RATE: Sed Rate: 13 mm/h (ref 0–30)

## 2024-05-24 LAB — C-REACTIVE PROTEIN: CRP: 8 mg/L (ref 0–10)

## 2024-05-24 LAB — METHYLMALONIC ACID, SERUM: Methylmalonic Acid: 339 nmol/L (ref 0–378)

## 2024-05-24 MED ORDER — B COMPLEX VITAMINS PO CAPS: 1.0000 | ORAL_CAPSULE | Freq: Every day | ORAL | 3 refills | Status: AC

## 2024-05-24 MED ORDER — PREDNISONE 10 MG (21) PO TBPK: ORAL_TABLET | ORAL | 0 refills | Status: AC

## 2024-05-24 NOTE — Progress Notes (Signed)
 Called pt he is advised of his lab results and recommendation he is agreeable to the Rx

## 2024-05-25 ENCOUNTER — Ambulatory Visit: Payer: MEDICAID

## 2024-07-23 ENCOUNTER — Ambulatory Visit: Payer: MEDICAID | Admitting: Family Medicine

## 2024-07-27 ENCOUNTER — Ambulatory Visit: Payer: MEDICAID | Admitting: Family Medicine

## 2024-07-27 ENCOUNTER — Encounter: Payer: Self-pay | Admitting: Family Medicine

## 2024-07-27 VITALS — BP 115/75 | HR 66 | Ht 66.0 in | Wt 143.4 lb

## 2024-07-27 DIAGNOSIS — G629 Polyneuropathy, unspecified: Secondary | ICD-10-CM

## 2024-07-27 DIAGNOSIS — E538 Deficiency of other specified B group vitamins: Secondary | ICD-10-CM

## 2024-07-27 DIAGNOSIS — Z23 Encounter for immunization: Secondary | ICD-10-CM

## 2024-07-27 DIAGNOSIS — R634 Abnormal weight loss: Secondary | ICD-10-CM

## 2024-07-27 NOTE — Progress Notes (Unsigned)
 "   Subjective   Patient ID: Alex Turner, male    DOB: [birth year]  Age: 54 y.o. MRN: [id number]  Chief Complaint  Patient presents with   Medical Management of Chronic Issues    Discussed the use of AI scribe software for clinical note transcription with the patient, who gave verbal consent to proceed.  History of Present Illness   Alex Turner is a 54 year old male who presents with worsening leg pain and new onset nausea.  He has persistent right leg pain with tingling and numbness that has worsened and now involves the hip, most severe below the knee and in the foot. He describes burning pain and electric shock sensations in the foot, with numbness of the baby toe while the big toe is spared. Symptoms worsened after a neck movement that briefly affected his left side, but only the right side remains symptomatic. He uses pain medication only when symptoms are severe.  He has daily nausea with poor appetite and unintentional weight loss from the 150s to the low 140s over 6 months. He has brief daily episodes of sudden nausea, feeling hot, and lightheadedness lasting 30 to 45 seconds, without vision changes or significant sweating.  He has elevated homocysteine and takes B12, folate, and possibly B6, with some improvement in appetite and walking since restarting these vitamins. He wonders if B12 deficiency is contributing to his leg symptoms.  His father had colon cancer. He had a colonoscopy and EGD about 10 years ago that were normal except for mild gastritis. He reports longstanding stomach issues and is used to these symptoms.          The ASCVD Risk score (Arnett DK, et al., 2019) failed to calculate for the following reasons:   Risk score cannot be calculated because patient has a medical history suggesting prior/existing ASCVD   * - Cholesterol units were assumed  Health Maintenance Due  Topic Date Due   FOOT EXAM  Never done   OPHTHALMOLOGY EXAM  Never done   Hepatitis  B Vaccines 19-59 Average Risk (1 of 3 - 19+ 3-dose series) Never done   COVID-19 Vaccine (1 - 2025-26 season) Never done   Colonoscopy  03/28/2024   Diabetic kidney evaluation - Urine ACR  07/27/2024      Objective:     BP 115/75   Pulse 66   Ht 5' 6 (1.676 m)   Wt 143 lb 6.4 oz (65 kg)   SpO2 97%   BMI 23.15 kg/m  {Vitals History (Optional):23777}  Physical Exam   MEASUREMENTS: Weight- 143.        No results found for any visits on 07/27/24.      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 Differential includes piriformis syndrome and sciatic nerve compression. Symptoms improved with B vitamin supplementation but persist. - Continue B12 and folate supplementation. - Follow up in 6 months.  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 Symptoms improved with supplementation. Possible malabsorption or dietary deficiency. - Continue B12 and folate supplementation year-round.  Chronic nausea, anorexia, and weight loss Chronic nausea, anorexia, and weight loss with episodes of nausea and lightheadedness. Possible B vitamin deficiency and chronic gastritis contributing. - Ordered additional labs to evaluate nausea and appetite issues. - Referred to gastroenterology for further evaluation, including potential colonoscopy.  Chronic gastritis Mild gastritis noted on previous EGD with chronic stomach issues and nausea. - Referred to  gastroenterology for further evaluation.  Chronic obstructive pulmonary disease COPD managed with inhaler. Reports difficulty using mist inhaler due to choking sensation. - Continue current inhaler regimen.  General health maintenance Routine health maintenance discussed. Gastroenterology referral needed due to family history of colon cancer and previous normal colonoscopy 10 years ago. - Referred to gastroenterology for evaluation, including potential colonoscopy.           Return in about 5 months (around 12/24/2024) for physical.    Toribio MARLA Slain, MD  "

## 2024-07-27 NOTE — Patient Instructions (Signed)
" ° °  YOUR PLAN: RIGHT LEG NEUROPATHIC PAIN AND SCIATICA: You have chronic neuropathic pain with tingling, numbness, burning, and electric shock sensations in your right leg, possibly due to piriformis syndrome or sciatic nerve compression. -Continue taking B12 and folate supplements. -Follow up in 6 months.  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 -Continue taking B12 and folate supplements year-round.  CHRONIC NAUSEA, ANOREXIA, AND WEIGHT LOSS: You have chronic nausea, poor appetite, and weight loss, which may be related to B vitamin deficiency and chronic gastritis. -We ordered additional labs to evaluate your nausea and appetite issues. -You are referred to gastroenterology for further evaluation, including a potential colonoscopy.  CHRONIC GASTRITIS: You have a history of mild gastritis and chronic stomach issues. -You are referred to gastroenterology for further evaluation.  CHRONIC OBSTRUCTIVE PULMONARY DISEASE (COPD): You have COPD and are currently using an inhaler, though you report difficulty using the mist inhaler due to a choking sensation. -Continue your current inhaler regimen.  GENERAL HEALTH MAINTENANCE: Routine health maintenance was discussed, and a gastroenterology referral is needed due to your family history of colon cancer and your previous normal colonoscopy 10 years ago. -You are referred to gastroenterology for evaluation, including a potential colonoscopy.   "

## 2024-11-27 ENCOUNTER — Ambulatory Visit: Admitting: Neurology

## 2024-12-28 ENCOUNTER — Other Ambulatory Visit: Payer: MEDICAID

## 2025-01-04 ENCOUNTER — Encounter: Payer: MEDICAID | Admitting: Family Medicine
